# Patient Record
Sex: Female | Born: 1975 | Hispanic: No | Marital: Married | Smoking: Never smoker
Health system: Southern US, Community
[De-identification: ages and names within clinical notes are randomized; demographics above are authoritative.]

## PROBLEM LIST (undated history)

## (undated) DIAGNOSIS — N921 Excessive and frequent menstruation with irregular cycle: Secondary | ICD-10-CM

## (undated) DIAGNOSIS — J4520 Mild intermittent asthma, uncomplicated: Secondary | ICD-10-CM

## (undated) DIAGNOSIS — E069 Thyroiditis, unspecified: Secondary | ICD-10-CM

## (undated) DIAGNOSIS — F411 Generalized anxiety disorder: Secondary | ICD-10-CM

## (undated) DIAGNOSIS — J3081 Allergic rhinitis due to animal (cat) (dog) hair and dander: Secondary | ICD-10-CM

## (undated) DIAGNOSIS — I8393 Asymptomatic varicose veins of bilateral lower extremities: Secondary | ICD-10-CM

## (undated) DIAGNOSIS — F338 Other recurrent depressive disorders: Secondary | ICD-10-CM

## (undated) DIAGNOSIS — R928 Other abnormal and inconclusive findings on diagnostic imaging of breast: Principal | ICD-10-CM

## (undated) DIAGNOSIS — K219 Gastro-esophageal reflux disease without esophagitis: Secondary | ICD-10-CM

## (undated) DIAGNOSIS — R1110 Vomiting, unspecified: Secondary | ICD-10-CM

## (undated) HISTORY — DX: Asymptomatic varicose veins of bilateral lower extremities: I83.93

## (undated) HISTORY — DX: Allergic rhinitis due to animal (cat) (dog) hair and dander: J30.81

## (undated) HISTORY — DX: Gastro-esophageal reflux disease without esophagitis: K21.9

## (undated) HISTORY — PX: ESOPHAGOGASTRODUODENOSCOPY: SHX1529

## (undated) HISTORY — DX: Excessive and frequent menstruation with irregular cycle: N92.1

## (undated) HISTORY — DX: Thyroiditis, unspecified: E06.9

## (undated) HISTORY — DX: Other recurrent depressive disorders: F33.8

## (undated) HISTORY — DX: Mild intermittent asthma, uncomplicated: J45.20

## (undated) HISTORY — DX: Generalized anxiety disorder: F41.1

## (undated) HISTORY — DX: Vomiting, unspecified: R11.10

---

## 2008-06-21 LAB — CONVERTED CEMR LAB: Pap Smear: NEGATIVE

## 2009-04-19 ENCOUNTER — Ambulatory Visit: Payer: Self-pay | Admitting: Internal Medicine

## 2009-04-19 DIAGNOSIS — J45909 Unspecified asthma, uncomplicated: Secondary | ICD-10-CM | POA: Insufficient documentation

## 2009-04-19 DIAGNOSIS — R61 Generalized hyperhidrosis: Secondary | ICD-10-CM | POA: Insufficient documentation

## 2009-04-19 DIAGNOSIS — J309 Allergic rhinitis, unspecified: Secondary | ICD-10-CM | POA: Insufficient documentation

## 2009-04-20 LAB — CONVERTED CEMR LAB
ALT: 12 units/L (ref 0–35)
AST: 19 units/L (ref 0–37)
Alkaline Phosphatase: 44 units/L (ref 39–117)
Basophils Absolute: 0 10*3/uL (ref 0.0–0.1)
Basophils Relative: 0.3 % (ref 0.0–3.0)
Bilirubin, Direct: 0.1 mg/dL (ref 0.0–0.3)
CO2: 26 meq/L (ref 19–32)
Calcium: 9.3 mg/dL (ref 8.4–10.5)
FSH: 0.2 milliintl units/mL
Hemoglobin: 14.6 g/dL (ref 12.0–15.0)
MCHC: 33.6 g/dL (ref 30.0–36.0)
MCV: 95.3 fL (ref 78.0–100.0)
Potassium: 4 meq/L (ref 3.5–5.1)
RBC: 4.57 M/uL (ref 3.87–5.11)
Total Protein: 7.8 g/dL (ref 6.0–8.3)
WBC: 8.8 10*3/uL (ref 4.5–10.5)

## 2009-05-30 ENCOUNTER — Telehealth: Payer: Self-pay | Admitting: Internal Medicine

## 2009-08-10 ENCOUNTER — Ambulatory Visit: Payer: Self-pay | Admitting: Internal Medicine

## 2009-08-10 DIAGNOSIS — J45901 Unspecified asthma with (acute) exacerbation: Secondary | ICD-10-CM | POA: Insufficient documentation

## 2009-08-10 DIAGNOSIS — J019 Acute sinusitis, unspecified: Secondary | ICD-10-CM | POA: Insufficient documentation

## 2009-08-13 ENCOUNTER — Telehealth: Payer: Self-pay | Admitting: Internal Medicine

## 2010-04-30 NOTE — Assessment & Plan Note (Signed)
Summary: SORE THROAT/VIRUS/ WANTED EARLIER THAN 11:30 W/ VAL/NWS   Vital Signs:  Patient profile:   35 year old female Weight:      136 pounds Temp:     98.9 degrees F oral BP sitting:   116 / 64  (left arm) Cuff size:   regular  Vitals Entered By: Duard Brady LPN (Aug 10, 2009 9:34 AM) CC: c/o sore throat , fever 99-101 all week Is Patient Diabetic? No   Primary Care Provider:  Newt Lukes MD  CC:  c/o sore throat  and fever 99-101 all week.  History of Present Illness: here with 1 wk onset severe ST and now facial pain, pressure and greenish d/c, fever to 101 with general malaise and weakness after recent travel;  seen Mon at urgent care but pain seems worse;   Pt denies CP,  orthopnea, pnd, worsening LE edema, palps, dizziness or syncope, but with increased chest tightness, harder to take deep breath and wheeze for the last 1-2 days.   Husband was ill too but symptoms were different.  All despite asa, motrin, chloraseptic.   Allergy meds seems to have worked well until this past wk without significant nasal allegy congestion.  Planning to move permanetnly to [place] soon - she and husband relocating.  Preventive Screening-Counseling & Management  Alcohol-Tobacco     Smoking Status: never  Problems Prior to Update: 1)  Asthma Unspecified With Exacerbation  (ICD-493.92) 2)  Sinusitis- Acute-nos  (ICD-461.9) 3)  Sweating  (ICD-780.8) 4)  Asthma  (ICD-493.90) 5)  Allergic Rhinitis  (ICD-477.9)  Medications Prior to Update: 1)  Necon 10/11 (28) 35 Mcg Tabs (Norethin-Eth Estrad Biphasic) .... Take As Directed 2)  Multivitamins  Tabs (Multiple Vitamin) .... Once Daily 3)  Proair Hfa 108 (90 Base) Mcg/act Aers (Albuterol Sulfate) .... As Needed 4)  Benadryl 25 Mg Tabs (Diphenhydramine Hcl) .... As Needed 5)  Drysol 20 % Soln (Aluminum Chloride) .... Apply At Bedtime  Current Medications (verified): 1)  Necon 10/11 (28) 35 Mcg Tabs (Norethin-Eth Estrad Biphasic) ....  Take As Directed 2)  Multivitamins  Tabs (Multiple Vitamin) .... Once Daily 3)  Proair Hfa 108 (90 Base) Mcg/act Aers (Albuterol Sulfate) .... 2 Puffs Four Times Per Day As Needed 4)  Benadryl 25 Mg Tabs (Diphenhydramine Hcl) .... As Needed 5)  Drysol 20 % Soln (Aluminum Chloride) .... Apply At Bedtime 6)  Azithromycin 250 Mg Tabs (Azithromycin) .... 2po Qd For 1 Day, Then 1po Qd For 4days, Then Stop 7)  Hydrocodone-Homatropine 5-1.5 Mg/34ml Syrp (Hydrocodone-Homatropine) .Marland Kitchen.. 1 Tsp By Mouth Q 6 Hrs As Needed Cough 8)  Prednisone 10 Mg Tabs (Prednisone) .... 4po Qd For 3days, Then 3po Qd For 3days, Then 2po Qd For 3days, Then 1po Qd For 3 Days, Then Stop  Allergies: 1)  ! 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  , mild ill Head:  normocephalic and atraumatic.   Eyes:  vision grossly intact, pupils equal, and pupils round.   Ears:  bilat tm's mild red, sinus tender Nose:  nasal dischargemucosal pallor and mucosal edema.   Mouth:  pharyngeal erythema and fair dentition.   Neck:  supple and cervical lymphadenopathy.   Lungs:  normal respiratory effort, R decreased breath sounds, R wheezes, L decreased breath sounds, and L wheezes.   Heart:  normal rate and regular rhythm.   Extremities:  no edema, no erythema    Impression & Recommendations:  Problem # 1:  SINUSITIS- ACUTE-NOS (ICD-461.9)  mild to mod, for antibx, and cough med - treat as above, f/u any worsening signs or symptoms   Her updated medication list for this problem includes:    Azithromycin 250 Mg Tabs (Azithromycin)  .Marland Kitchen... 2po qd for 1 day, then 1po qd for 4days, then stop    Hydrocodone-homatropine 5-1.5 Mg/46ml Syrp (Hydrocodone-homatropine) .Marland Kitchen... 1 tsp by mouth q 6 hrs as needed cough  Problem # 2:  ASTHMA UNSPECIFIED WITH EXACERBATION (ICD-493.92)  Her updated medication list for this problem includes:    Proair Hfa 108 (90 Base) Mcg/act Aers (Albuterol sulfate) .Marland Kitchen... 2 puffs four times per day as needed    Prednisone 10 Mg Tabs (Prednisone) .Marland KitchenMarland KitchenMarland KitchenMarland Kitchen 4po qd for 3days, then 3po qd for 3days, then 2po qd for 3days, then 1po qd for 3 days, then stop for depomedrol today, and prednisone taper off, and proair refill - treat as above, f/u any worsening signs or symptoms   Orders: Depo- Medrol 80mg  (J1040) Depo- Medrol 40mg  (J1030) Admin of Therapeutic Inj  intramuscular or subcutaneous (52841)  Problem # 3:  ALLERGIC RHINITIS (ICD-477.9)  Her updated medication list for this problem includes:    Benadryl 25 Mg Tabs (Diphenhydramine hcl) .Marland Kitchen... As needed stable overall by hx and exam, ok to continue meds/tx as is   Complete Medication List: 1)  Necon 10/11 (28) 35 Mcg Tabs (Norethin-eth estrad biphasic) .... Take as directed 2)  Multivitamins Tabs (Multiple vitamin) .... Once daily 3)  Proair Hfa 108 (90 Base) Mcg/act Aers (Albuterol sulfate) .... 2 puffs four times per day as needed 4)  Benadryl 25 Mg Tabs (Diphenhydramine hcl) .... As needed 5)  Drysol 20 % Soln (Aluminum chloride) .... Apply at bedtime 6)  Azithromycin 250 Mg Tabs (Azithromycin) .... 2po qd for 1 day, then 1po qd for 4days, then stop 7)  Hydrocodone-homatropine 5-1.5 Mg/72ml Syrp (Hydrocodone-homatropine) .Marland Kitchen.. 1 tsp by mouth q 6 hrs as needed cough 8)  Prednisone 10 Mg Tabs (Prednisone) .... 4po qd for 3days, then 3po qd for 3days, then 2po qd for 3days, then 1po qd for 3 days, then stop  Patient Instructions: 1)  you had the steroid shot today 2)  Please take all new medications as prescribed - the antibiotic, cough medicine, and  prednisone 3)  you are given the refill on the proair 4)  Continue all previous medications as before this visit 5)  Please schedule a follow-up appointment or call with any worsening symptoms 6)  Good Luck in [place] Prescriptions: PREDNISONE 10 MG TABS (PREDNISONE) 4po qd for 3days, then 3po qd for 3days, then 2po qd for 3days, then 1po qd for 3 days, then stop  #30 x 0   Entered and Authorized by:   Corwin Levins MD   Signed by:   Corwin Levins MD on 08/10/2009   Method used:   Print then Give to Patient   RxID:   [id number] HYDROCODONE-HOMATROPINE 5-1.5 MG/5ML SYRP (HYDROCODONE-HOMATROPINE) 1 tsp by mouth q 6 hrs as needed cough  #6oz x 1   Entered and Authorized by:   Corwin Levins MD   Signed  by:   Corwin Levins MD on 08/10/2009   Method used:   Print then Give to Patient   RxID:   [id number] PROAIR HFA 108 (90 BASE) MCG/ACT AERS (ALBUTEROL SULFATE) 2 puffs four times per day as needed  #1 x 11   Entered and Authorized by:   Corwin Levins MD   Signed by:   Corwin Levins MD on 08/10/2009   Method used:   Print then Give to Patient   RxID:   [id number] AZITHROMYCIN 250 MG TABS (AZITHROMYCIN) 2po qd for 1 day, then 1po qd for 4days, then stop  #6 x 1   Entered and Authorized by:   Corwin Levins MD   Signed by:   Corwin Levins MD on 08/10/2009   Method used:   Print then Give to Patient   RxID:   [id number]    Medication Administration  Injection # 1:    Medication: Depo- Medrol 80mg     Diagnosis: ASTHMA UNSPECIFIED WITH EXACERBATION (ICD-493.92)    Route: IM    Site: RUOQ gluteus    Exp Date: 01/2012    Lot #: [id number]    Mfr: [employer]    Patient tolerated injection without complications    Given by: Duard Brady LPN (Aug 10, 2009 10:34 AM)  Injection # 2:    Medication: Depo- Medrol 40mg     Diagnosis: ASTHMA UNSPECIFIED WITH EXACERBATION [phone])    Route: IM    Site: RUOQ gluteus    Exp Date: 01/2012    Lot #: [id number]    Mfr: [employer]     Patient tolerated injection without complications    Given by: Duard Brady LPN (Aug 10, 2009 10:34 AM)  Orders Added: 1)  Depo- Medrol 80mg  [J1040] 2)  Depo- Medrol 40mg  [J1030] 3)  Admin of Therapeutic Inj  intramuscular or subcutaneous [96372] 4)  Est. Patient Level IV [71696]

## 2010-04-30 NOTE — Progress Notes (Signed)
Summary: Allergic reaction?  Phone Note Call from Patient Call back at [facility] Phone [phone]   Summary of Call: Pt has hives on elbow,arms and hands. Pt given antibiotic on 5/13 but hives just appeared, pt went to pharmacy and pharmacist advised her to take benaydryl so she took the benadryl. Should she stop antibitoic and prednisone, please let pt know....   Courtney Mullins, Courtney Mullins (05-22-1975) [phone]  Cheryl  Follow-up for Phone Call        Ok to advise pt to stop ABX and continue Pred and Benadryl? Follow-up by: Margaret Pyle, CMA,  Aug 13, 2009 11:39 AM  Additional Follow-up for Phone Call Additional follow up Details #1::        should stop the zpack (and inform pt this is likely the cuase - no more zpack in the future - likely new allergy);  Continue all previous medications including the prednisone ;  will hold on further medicaiton if her infectious symptoms are now improved Additional Follow-up by: Corwin Levins MD,  Aug 13, 2009 12:38 PM   New Allergies: ! * AZITHROMYCIN Additional Follow-up for Phone Call Additional follow up Details #2::    pt informed and will call back if needed for infection sxs Follow-up by: Margaret Pyle, CMA,  Aug 13, 2009 3:33 PM  New Allergies: ! * AZITHROMYCIN

## 2010-04-30 NOTE — Progress Notes (Signed)
Summary: drysol  Phone Note Call from Patient Call back at [facility] Phone [phone]   Caller: Patient Summary of Call: pt called to ask MD if she thought that Drysol 35ml would be help with her sweating, please advise. Initial call taken by: Margaret Pyle, CMA,  May 30, 2009 3:26 PM  Follow-up for Phone Call        drysol is a topical solution to apply at night - ok to try - e-rx done Follow-up by: Newt Lukes MD,  May 30, 2009 4:07 PM  Additional Follow-up for Phone Call Additional follow up Details #1::        pt informed Additional Follow-up by: Margaret Pyle, CMA,  May 30, 2009 4:21 PM    New/Updated Medications: DRYSOL 20 % SOLN (ALUMINUM CHLORIDE) apply at bedtime Prescriptions: DRYSOL 20 % SOLN (ALUMINUM CHLORIDE) apply at bedtime  #1 x 3   Entered and Authorized by:   Newt Lukes MD   Signed by:   Newt Lukes MD on 05/30/2009   Method used:   Electronically to        [id number] (retail)       [address]       [place], [place]  [address]       Ph: [phone] or [phone]       Fax: [phone]   RxID:   [id number]

## 2010-04-30 NOTE — Assessment & Plan Note (Signed)
Summary: NEW/BCBS/ REFERRED BY OBGYN/NWS   Vital Signs:  Patient profile:   35 year old female Height:      68.5 inches (173.99 cm) Weight:      139.4 pounds (63.36 kg) BMI:     20.96 O2 Sat:      98 % on Room air Temp:     99.2 degrees F (37.33 degrees C) oral Pulse rate:   84 / minute BP sitting:   126 / 90  (left arm) Cuff size:   regular  Vitals Entered By: Orlan Leavens (April 19, 2009 11:13 AM)  O2 Flow:  Room air CC: New patient Is Patient Diabetic? No Pain Assessment Patient in pain? no        Primary Care Provider:  Newt Lukes MD  CC:  New patient.  History of Present Illness: new pt to me and our practice - here today to est care  concerned about sweating - onset 1year ago but worse in past 2-3 mos - symptoms are present daily, several episodes of drenching thro daiy - not seeming related to menstral cycle - no change in medications in past year but +move to [place] from [place] and [place] in past year - no fever or chills no out of country travel - no rash no weight loss no lumps or swelling - no cough or rash - denies risk factors for HIV infx or TB symptoms not a/w increase exertion or stress +night sweats requiring change of night clothes and bedding last week symptoms not improved by use of benadryl instead of zyrtec for allg recent labs at gyn show normal thyroid - pt concerned also about hormone babance causing problem  Preventive Screening-Counseling & Management  Alcohol-Tobacco     Smoking Status: never  Current Medications (verified): 1)  Necon 10/11 (28) 35 Mcg Tabs (Norethin-Eth Estrad Biphasic) .... Take As Directed 2)  Multivitamins  Tabs (Multiple Vitamin) .... 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 I have reviewed all other systems and they were negative.   Physical Exam  General:  alert, well-developed, well-nourished, and cooperative to examination.    Eyes:  vision grossly intact; pupils equal, round and reactive to light.  conjunctiva and lids normal.    Ears:  normal pinnae bilaterally, without erythema, swelling, or tenderness to palpation. TMs clear, without effusion, or cerumen impaction. Hearing grossly normal bilaterally  Mouth:  teeth and gums in good repair; mucous membranes moist, without lesions or ulcers. oropharynx clear without exudate, no erythema.  Lungs:  normal respiratory effort, no intercostal retractions or use of accessory muscles; normal breath sounds bilaterally - no crackles and no wheezes.    Heart:  normal rate, regular rhythm, no murmur, and no rub. BLE without edema. normal DP pulses and normal cap refill in all 4 extremities    Abdomen:  soft, non-tender, normal bowel sounds, no distention; no masses and no appreciable hepatomegaly or splenomegaly.   Msk:  No deformity or scoliosis noted of thoracic or lumbar spine.   Neurologic:  alert & oriented X3 and cranial nerves II-XII symetrically intact.  strength normal in all extremities, sensation  intact to light touch, and gait normal. speech fluent without dysarthria or aphasia; follows commands with good comprehension.  Skin:  no rashes, vesicles, ulcers, or erythema. No nodules or irregularity to palpation.  Cervical Nodes:  No lymphadenopathy noted Axillary Nodes:  No palpable lymphadenopathy Inguinal Nodes:  No significant adenopathy Psych:  Oriented X3, memory intact for recent and remote, normally interactive, good eye contact, not anxious appearing, not depressed appearing, and not agitated.      Impression &  Recommendations:  Problem # 1:  SWEATING (ICD-780.8) up to date article reviewed in depth as to work up and explained to pt - no obvious risks for infx or malignancy on hx - TSH at gyn recently tested and norma (reviewed fax copy of tests today) will check other labs to r/o med dz - also CXR to look to mediastinal changes or TB changes - FSH at pt request d/t concern for early menopause but note on BCP - rec f/u back with gyn on this Time spent with patient 45 minutes, more than 50% of this time was spent counseling patient on causes for sweating and medical workup of same - - 1% of population affected by excess sweating - also my be most likely [place] (same time frame as onset of symptoms)  Orders: TLB-BMP (Basic Metabolic Panel-BMET) (80048-METABOL) TLB-CBC Platelet - w/Differential (85025-CBCD) TLB-Hepatic/Liver Function Pnl (80076-HEPATIC) T-2 View CXR (71020TC) TLB-FSH (Follicle Stimulating Hormone) (83001-FSH)  Problem # 2:  ALLERGIC RHINITIS (ICD-477.9)  Discussed use of allergy medications and environmental measures.   Her updated medication list for this problem includes:    Benadryl 25 Mg Tabs (Diphenhydramine hcl) .Marland Kitchen... As needed  Complete Medication List: 1)  Necon 10/11 (28) 35 Mcg Tabs (Norethin-eth estrad biphasic) .... Take as directed 2)  Multivitamins Tabs (Multiple vitamin) .... Once daily 3)  Proair Hfa 108 (90 Base) Mcg/act Aers (Albuterol sulfate) .... As needed 4)  Benadryl 25 Mg Tabs (Diphenhydramine hcl) .... As needed  Patient Instructions: 1)  it was good to see you today.  2)  test(s) ordered today - your results will be posted on the phone tree for review in 48-72 hours from the time of test completion; call [phone] and enter your 9 digit MRN (listed above on this page, just below your name); if any changes need to be made or there are abnormal results, you will be contacted directly.  3)  If no  apparent medical reason identified for sweating found on these labs, we can feel reassured for now that this may be a normal variant or result of your environmental changes - however, if your symptoms change, you should contact us for re-evaluation - 4)  if you desire further evaluation by an allergist, please let us know and we can make a referrral for you   Pap Smear  Procedure date:  06/21/2008  Findings:      Interpretation/Result:Negative for intraepithelial Lesion or Malignancy.

## 2010-09-20 HISTORY — PX: TUBAL LIGATION: SHX77

## 2011-09-11 ENCOUNTER — Encounter: Payer: Self-pay | Admitting: Family Medicine

## 2011-09-11 ENCOUNTER — Ambulatory Visit (INDEPENDENT_AMBULATORY_CARE_PROVIDER_SITE_OTHER): Payer: BC Managed Care – PPO | Admitting: Family Medicine

## 2011-09-11 VITALS — BP 124/87 | HR 91 | Temp 98.0°F | Ht 67.75 in | Wt 137.0 lb

## 2011-09-11 DIAGNOSIS — F411 Generalized anxiety disorder: Secondary | ICD-10-CM

## 2011-09-11 DIAGNOSIS — R1110 Vomiting, unspecified: Secondary | ICD-10-CM

## 2011-09-11 NOTE — Assessment & Plan Note (Signed)
Extensive workup done in [place]. Will get records and review them to see if anything else is warranted. She'll return in 2 wks to discuss.

## 2011-09-11 NOTE — Progress Notes (Signed)
Office Note 09/11/2011  CC:  Chief Complaint  Patient presents with  . Establish Care    discuss citalopram    HPI:  Courtney Mullins is a 36 y.o. White female who is here to establish care. Patient's most recent primary MD:  MD in [place]. Old records were not reviewed prior to or during today's visit.  Pt was started on citalopram 20mg  about a year ago for GAD and depression and it helped, but she thinks it has actually made her too relaxed, nonchalant.  She forgets things more easily--forgot where she placed her wedding ring recently and hasn't found it yet.  She feels like since she moved from [place] back to [place] she may be able to ween off this med altogether.  She has hx of chronic worry but no panic attacks.  Low grade/mild depression in [place] (sounds like seasonal affective disorder).  No hx of any other psych meds prior to citalopram.  Also says she has started having an old problem resurface in the last 2 wks: recurrent episodes of vomiting while eating, seems random (no trigger consistently identified).  She describes feeling flushing in neck and face, feels like her cheeks may be tingly or swollen, then she vomits.  She then feels immediately well and can go right back to eating exactly what she was eating.  Has some epigastric pain with it.  She says EGD has been done and some ulcers were found, H pylori neg.  She says an allergy evaluation was done and was unremarkable, including food allergy testing.  She says it was then thought that maybe stress/anxiety/panic was the cause so she was put on the citalopram and it has not affected these episodes at all she says.  She was put on daily nonsedating antihistamine (allegra, zyrtec, claritin) with no effect.  Prevacid, prilosec, and ranitidine have no impact on these episodes.  Past Medical History  Diagnosis Date  . Asthma, intermittent controlled   . Depression   . Recurrent vomiting   . GERD (gastroesophageal reflux  disease)     Past Surgical History  Procedure Date  . Tubal ligation   . Esophagogastroduodenoscopy     Family History  Problem Relation Age of Onset  . Cancer Father      prostate and testicular  . Dementia Father     Died age 67 of Lewy body dementia  . Stroke Maternal Grandfather   . Stroke Paternal Grandfather     History   Social History  . Marital Status: Married    Spouse Name: Clifton Custard    Number of Children: 1  . Years of Education: N/A   Occupational History  . Not on file.   Social History Main Topics  . Smoking status: Never Smoker   . Smokeless tobacco: Never Used  . Alcohol Use: No  . Drug Use: No  . Sexually Active:    Other Topics Concern  . Not on file   Social History Narrative   Married, one 6 y/o son.Orig from [place].Stay home mom.  Husband works for [employer].No T/A/Ds    Outpatient Encounter Prescriptions as of 09/11/2011  Medication Sig Dispense Refill  . citalopram (CELEXA) 20 MG tablet Take 20 mg by mouth daily.        Allergies  Allergen Reactions  . Percocet (Oxycodone-Acetaminophen)   . Sulfa Antibiotics     ROS Review of Systems  Constitutional: Negative for fever and fatigue.  HENT: Negative for congestion and sore throat.   Eyes:  Negative for visual disturbance.  Respiratory: Negative for cough.   Cardiovascular: Negative for chest pain.  Gastrointestinal: Positive for vomiting and abdominal pain. Negative for nausea.       As per HPI  Genitourinary: Negative for dysuria.  Musculoskeletal: Negative for back pain and joint swelling.  Skin: Negative for rash.  Neurological: Negative for weakness and headaches.  Hematological: Negative for adenopathy.  Psychiatric/Behavioral: Negative for disturbed wake/sleep cycle and dysphoric mood. The patient is not nervous/anxious.     PE; Blood pressure 124/87, pulse 91, temperature 98 F (36.7 C), temperature source Temporal, height 5' 7.75" (1.721 m), weight 137 lb (62.143  kg), last menstrual period 08/22/2011, SpO2 100.00%. Gen: Alert, well appearing.  Patient is oriented to person, place, time, and situation. ENT: Ears: EACs clear, normal epithelium.  TMs with good light reflex and landmarks bilaterally.  Eyes: no injection, icteris, swelling, or exudate.  EOMI, PERRLA. Nose: no drainage or turbinate edema/swelling.  No injection or focal lesion.  Mouth: lips without lesion/swelling.  Oral mucosa pink and moist.  Dentition intact and without obvious caries or gingival swelling.  Oropharynx without erythema, exudate, or swelling.  Neck - No masses or thyromegaly or limitation in range of motion CV: RRR, no m/r/g.   LUNGS: CTA bilat, nonlabored resps, good aeration in all lung fields. ABD: soft, NT, ND, BS normal.  No hepatospenomegaly or mass.  No bruits. EXT: no clubbing, cyanosis, or edema.  Skin - no sores or suspicious lesions or rashes or color changes  Pertinent labs:  None today  ASSESSMENT AND PLAN:   New pt: obtain old records  GAD (generalized anxiety disorder) OK to slowly ween off this med and see how she does. Take 1/2 of her 20mg  citalopram for a few wks, then when she gets down to 5 tabs left she'll take 1/2 tab every other day. She will call or return if she has rebound sx's or issues with weening.   Recurrent vomiting Extensive workup done in [place]. Will get records and review them to see if anything else is warranted. She'll return in 2 wks to discuss.    Return in about 2 weeks (around 09/25/2011).

## 2011-09-11 NOTE — Patient Instructions (Addendum)
Take your citalopram 1/2 20mg  tab until you have 5 left, then take 1/2 tab every other day until they are gone.

## 2011-09-11 NOTE — Assessment & Plan Note (Addendum)
OK to slowly ween off this med and see how she does. Take 1/2 of her 20mg  citalopram for a few wks, then when she gets down to 5 tabs left she'll take 1/2 tab every other day. She will call or return if she has rebound sx's or issues with weening.

## 2011-09-24 ENCOUNTER — Ambulatory Visit: Payer: BC Managed Care – PPO | Admitting: Family Medicine

## 2011-09-29 ENCOUNTER — Encounter: Payer: Self-pay | Admitting: Family Medicine

## 2011-10-08 ENCOUNTER — Ambulatory Visit: Payer: BC Managed Care – PPO | Admitting: Family Medicine

## 2011-10-09 ENCOUNTER — Other Ambulatory Visit: Payer: Self-pay | Admitting: Family Medicine

## 2011-10-09 ENCOUNTER — Encounter: Payer: Self-pay | Admitting: Family Medicine

## 2011-10-09 ENCOUNTER — Ambulatory Visit (INDEPENDENT_AMBULATORY_CARE_PROVIDER_SITE_OTHER): Payer: BC Managed Care – PPO | Admitting: Family Medicine

## 2011-10-09 ENCOUNTER — Ambulatory Visit (INDEPENDENT_AMBULATORY_CARE_PROVIDER_SITE_OTHER)
Admission: RE | Admit: 2011-10-09 | Discharge: 2011-10-09 | Disposition: A | Discharge status: Home / Self Care | Payer: BC Managed Care – PPO | Source: Ambulatory Visit | Attending: Family Medicine | Admitting: Family Medicine

## 2011-10-09 VITALS — BP 116/76 | HR 69 | Ht 67.75 in | Wt 143.0 lb

## 2011-10-09 DIAGNOSIS — R1110 Vomiting, unspecified: Secondary | ICD-10-CM

## 2011-10-09 DIAGNOSIS — IMO0001 Reserved for inherently not codable concepts without codable children: Secondary | ICD-10-CM | POA: Insufficient documentation

## 2011-10-09 DIAGNOSIS — T148XXA Other injury of unspecified body region, initial encounter: Secondary | ICD-10-CM

## 2011-10-09 DIAGNOSIS — S6390XA Sprain of unspecified part of unspecified wrist and hand, initial encounter: Secondary | ICD-10-CM

## 2011-10-09 DIAGNOSIS — F411 Generalized anxiety disorder: Secondary | ICD-10-CM

## 2011-10-09 MED ORDER — HYDROCODONE-ACETAMINOPHEN 5-500 MG PO TABS: ORAL_TABLET | ORAL | Status: DC

## 2011-10-09 MED ORDER — CITALOPRAM HYDROBROMIDE 20 MG PO TABS: 20.0000 mg | ORAL_TABLET | Freq: Every day | ORAL | Status: DC

## 2011-10-09 MED ORDER — BUSPIRONE HCL 7.5 MG PO TABS: ORAL_TABLET | ORAL | Status: DC

## 2011-10-09 MED ORDER — BUSPIRONE HCL 7.5 MG PO TABS: 7.5000 mg | ORAL_TABLET | Freq: Three times a day (TID) | ORAL | Status: DC

## 2011-10-09 NOTE — Assessment & Plan Note (Signed)
X-ray done in office today: looks normal to me but will call pt after radiologist reports his reading of it. I placed a splint on pt today: finger splint from prox 4th metacarpel extending to tip of 4th finger, buddy taped to middle finger.  The palmar aspect of this splint was then wrapped in an ace bandage. Vicodin 5/500, 1-2 q6h prn pain, #40, no RF.  Therapeutic expectations and side effect profile of medication discussed today.  Patient's questions answered.

## 2011-10-09 NOTE — Progress Notes (Signed)
2OFFICE VISIT  10/09/2011   CC:  Chief Complaint  Patient presents with  . Follow-up    anxiety  . Hand Pain    left ring finger, got caught in bike trailer Tuesday evening     HPI:    Patient is a 36 y.o. Caucasian female who presents for 1 mo f/u for anxiety. Felt anxiety and random n/v increase when she decreased her citalopram to 10mg . Again, describes periods in which she cannot keep anything down--nausea precedes the vomiting but afterwards she feels fine.  No connection with any particular food.  She even tried altering her diet as per her chiropracter's recommendations, tried chiropractic manipulations to see if this would help and the only thing that helped was getting back on full 20mg  tab of citalopram, and the thing that help most profoundly in the last week is a friend's meclizine tabs---no vomiting at all since starting this a week ago. Denies GERD.  No HA's.  No dizziness, no vertigo, no abd pain.   Reviewed past GI w/u and this has been pretty complete and without remarkable findings.  Also, she hurt her left hand ring finger 2 days ago (tripped in her garage and the finger got twisted a bit in a grate-like surface on a trailer in her garage.  Pain and swelling has gradually worsened.  Hurst the worst on volar aspect, worst when flexion/extending finger.  Ice has helped some but tylenol hasn't.  She has not splinted it.  She avoids NSAIDs due to past problems with her stomach.  Past Medical History  Diagnosis Date  . Asthma, intermittent controlled     vs mild persistent  . Seasonal affective disorder     while living in [place]  . 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  Stomach bx-mild chronic gastritis, H pylori neg.  Duodenal bx normal.  . GERD (gastroesophageal reflux disease)   . Thyroiditis 6th grade    has been euthyroid x many years since this.  . 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  Marland Kitchen GAD  (generalized anxiety disorder)     Past Surgical History  Procedure Date  . Tubal ligation 09/20/10    Hysteroscopic tubal ligation  . Esophagogastroduodenoscopy     Outpatient Prescriptions Prior to Visit  Medication Sig Dispense Refill  . citalopram (CELEXA) 20 MG tablet Take 20 mg by mouth daily.        Allergies  Allergen Reactions  . Percocet (Oxycodone-Acetaminophen) Other (See Comments)    Restlessness  . Sulfa Antibiotics     ROS As per HPI  PE: Blood pressure 116/76, pulse 69, height 5' 7.75" (1.721 m), weight 143 lb (64.864 kg), last menstrual period 10/08/2011. Gen: Alert, well appearing.  Patient is oriented to person, place, time, and situation. CV: RRR, no m/r/g.   LUNGS: CTA bilat, nonlabored resps, good aeration in all lung fields. ABD: soft, NT, ND, BS diminished throughout all 4 quadrants.  No hepatospenomegaly or mass.  No bruits. Neuro: CN 2-12 intact bilaterally, strength 5/5 in proximal and distal upper extremities and lower extremities bilaterally.    No tremor.  No disdiadochokinesis.  No ataxia.   Left hand: mild swelling w/out erythema over ring finger PIP jt.  Moderate TTP over volar>dorsal aspects of ring finger from PIP jt to DIP.  All other regions nontender, including lateral collateral ligaments--no instability in these areas.  She has some trouble flexing and extending at  her PIP and DIP but SOME movement is seen and this is mainly inhibited by pain.    LABS:  X-ray in office today (left hand 4th finger): no fracture or dislocation by my reading; awaiting radiology over-read.  IMPRESSION AND PLAN:  GAD (generalized anxiety disorder) Improving now that she's back on 20mg  citalopram. Will add buspar 7.5 mg generic tabs, 2 tabs bid (start at 1 bid x 10d, then increase to 2 bid).  Therapeutic expectations and side effect profile of medication discussed today.  Patient's questions answered. F/u 60mo.  Recurrent vomiting GI w/u unremarkable.   Seems highly connected to her anxiety disorder. Continue anxiety meds, may continue prn meclizine as well.  Strain of fourth finger, left X-ray done in office today: looks normal to me but will call pt after radiologist reports his reading of it. I placed a splint on pt today: finger splint from prox 4th metacarpel extending to tip of 4th finger, buddy taped to middle finger.  The palmar aspect of this splint was then wrapped in an ace bandage. Vicodin 5/500, 1-2 q6h prn pain, #40, no RF.  Therapeutic expectations and side effect profile of medication discussed today.  Patient's questions answered.     FOLLOW UP: Return for 2 mo.  Or earlier, depending on the improvement of her hand and the x-ray result.

## 2011-10-09 NOTE — Assessment & Plan Note (Signed)
GI w/u unremarkable.  Seems highly connected to her anxiety disorder. Continue anxiety meds, may continue prn meclizine as well.

## 2011-10-09 NOTE — Assessment & Plan Note (Signed)
Improving now that she's back on 20mg  citalopram. Will add buspar 7.5 mg generic tabs, 2 tabs bid (start at 1 bid x 10d, then increase to 2 bid).  Therapeutic expectations and side effect profile of medication discussed today.  Patient's questions answered. F/u 58mo.

## 2011-11-20 ENCOUNTER — Other Ambulatory Visit: Payer: Self-pay | Admitting: *Deleted

## 2011-11-20 MED ORDER — BUSPIRONE HCL 7.5 MG PO TABS: 15.0000 mg | ORAL_TABLET | Freq: Two times a day (BID) | ORAL | Status: DC

## 2011-11-20 NOTE — Telephone Encounter (Signed)
Pt called stating that she has been getting  Buspirone #90, take 1 TID from pharmacy although she has been told by our office to take 2 BID.  Advised pt that she should be taking 2 BID.  Advised pt I will call [facility] and follow up with her.   Researched with [facility] and in [facility].  RC to pt.  Advised our office is partly in error for the incorrect directions.  Per Jonny Ruiz at [facility] they never received the RX stating 2 BID.  They did receive RX for TID.  Per [facility] we did send TID then changed to BID in same day.   Advised pt we have corrected RX with [facility] (verbal to John) for 30 day supply while I was on phone.  Sent 90 day through [facility].  Pt was notified.  She will keep follow up on 9/11.

## 2011-11-25 ENCOUNTER — Encounter: Payer: Self-pay | Admitting: Family Medicine

## 2011-11-25 ENCOUNTER — Ambulatory Visit (INDEPENDENT_AMBULATORY_CARE_PROVIDER_SITE_OTHER): Payer: BC Managed Care – PPO | Admitting: Family Medicine

## 2011-11-25 VITALS — BP 115/78 | HR 80 | Temp 96.6°F | Wt 144.0 lb

## 2011-11-25 DIAGNOSIS — L811 Chloasma: Secondary | ICD-10-CM

## 2011-11-25 DIAGNOSIS — L819 Disorder of pigmentation, unspecified: Secondary | ICD-10-CM

## 2011-11-25 DIAGNOSIS — G4700 Insomnia, unspecified: Secondary | ICD-10-CM

## 2011-11-25 DIAGNOSIS — N912 Amenorrhea, unspecified: Secondary | ICD-10-CM

## 2011-11-25 DIAGNOSIS — R238 Other skin changes: Secondary | ICD-10-CM

## 2011-11-25 DIAGNOSIS — B070 Plantar wart: Secondary | ICD-10-CM | POA: Insufficient documentation

## 2011-11-25 DIAGNOSIS — F411 Generalized anxiety disorder: Secondary | ICD-10-CM

## 2011-11-25 LAB — TSH: TSH: 2.22 u[IU]/mL (ref 0.35–5.50)

## 2011-11-25 LAB — PROLACTIN: Prolactin: 7.5 ng/mL

## 2011-11-25 LAB — COMPREHENSIVE METABOLIC PANEL
ALT: 22 U/L (ref 0–35)
AST: 28 U/L (ref 0–37)
CO2: 30 mEq/L (ref 19–32)
Calcium: 9.8 mg/dL (ref 8.4–10.5)
Chloride: 103 mEq/L (ref 96–112)
GFR: 74.13 mL/min (ref >60.00)
Sodium: 140 mEq/L (ref 135–145)
Total Bilirubin: 0.6 mg/dL (ref 0.3–1.2)
Total Protein: 7.5 g/dL (ref 6.0–8.3)

## 2011-11-25 LAB — CBC WITH DIFFERENTIAL/PLATELET
Basophils Absolute: 0 10*3/uL (ref 0.0–0.1)
Eosinophils Absolute: 0.1 10*3/uL (ref 0.0–0.7)
Lymphocytes Relative: 51 % — ABNORMAL HIGH (ref 12.0–46.0)
MCHC: 33 g/dL (ref 30.0–36.0)
Monocytes Relative: 6.6 % (ref 3.0–12.0)
Neutrophils Relative %: 40.4 % — ABNORMAL LOW (ref 43.0–77.0)
Platelets: 191 10*3/uL (ref 150.0–400.0)
RDW: 11.8 % (ref 11.5–14.6)

## 2011-11-25 LAB — TESTOSTERONE: Testosterone: 58.3 ng/dL (ref 10.00–70.00)

## 2011-11-25 LAB — PROGESTERONE: Progesterone: <0.2 ng/mL

## 2011-11-25 MED ORDER — CITALOPRAM HYDROBROMIDE 40 MG PO TABS: 40.0000 mg | ORAL_TABLET | Freq: Every day | ORAL | Status: DC

## 2011-11-25 NOTE — Progress Notes (Signed)
OFFICE NOTE  12/01/2011  CC:  Chief Complaint  Patient presents with  . acute    multiple issues     HPI: Patient is a 36 y.o. Caucasian female who is here for 61mo follow up anxiety--says no diff in anxiety level since buspar, maybe even acting a bit more irritable.   Much less GI issues since starting buspar.  Feels like her taste is less sensitive taste, having sleep initiation and maintenance problems, she feels like she has gained wt.  In general she feels LESS TIRED/less need for sleep on buspar. Has increase in malasma on face lately (above upper lip and a spot on forehead).  Easy bruisability. Says hands and feet "feel like ice" lately.    Left eye a bit red and irritated lately, dog sleeps on left side of patient's face/neck all the time.  No eye exudate or swelling.  Mild intermittent itching but no pain.     LMP was around end of May/beginning of June 2013, and she has always been very regular/predictable menses.  She has not done a home pregnancy test b/c she has had a tubal ligation. She asks about hormonal testing due to her conglomeration of sx's lately. Lastly, she reveals the primary reason for her appt today: left big toe with wart on bottom, hurts to walk on it, alters gait due to this is causing some ankle pains, even occ knee and hip discomfort on that side. Has tried OTC wart treatments the last month or so and nothing has helped.  Pertinent PMH:  Past Medical History  Diagnosis Date  . Asthma, intermittent controlled     vs mild persistent  . Seasonal affective disorder     while living in [place]  . 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  Stomach bx-mild chronic gastritis, H pylori neg.  Duodenal bx normal.  . GERD (gastroesophageal reflux disease)   . Thyroiditis 6th grade    has been euthyroid x many years since this.  . 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  Marland Kitchen GAD (generalized anxiety disorder)       MEDS:  Outpatient Prescriptions Prior to Visit  Medication Sig Dispense Refill  . HYDROcodone-acetaminophen (VICODIN) 5-500 MG per tablet 1-2 tabs po q6h prn pain  40 tablet  0  . busPIRone (BUSPAR) 7.5 MG tablet Take 2 tablets (15 mg total) by mouth 2 (two) times daily.  360 tablet  1  . citalopram (CELEXA) 20 MG tablet Take 1 tablet (20 mg total) by mouth daily.  30 tablet  3  **Note: she is not taking vicodin as listed above  PE: Blood pressure 115/78, pulse 80, temperature 96.6 F (35.9 C), temperature source Temporal, weight 144 lb (65.318 kg). Gen: Alert, well appearing.  Patient is oriented to person, place, time, and situation. AFFECT: pleasant, lucid thought and speech. ENT: Ears: EACs clear, normal epithelium.  TMs with good light reflex and landmarks bilaterally.  Eyes: no injection, icteris, swelling, or exudate.  EOMI, PERRLA. Nose: no drainage or turbinate edema/swelling.  No injection or focal lesion.  Mouth: lips without lesion/swelling.  Oral mucosa pink and moist.  Dentition intact and without obvious caries or gingival swelling.  Oropharynx without erythema, exudate, or swelling.  Neck - No masses or thyromegaly or limitation in range of motion CV: RRR, no m/r/g.   LUNGS: CTA bilat, nonlabored resps, good aeration in all lung fields. Neuro: CN 2-12 intact  bilaterally, strength 5/5 in proximal and distal upper extremities and lower extremities bilaterally. No tremor.  No disdiadochokinesis.  No ataxia.  IMPRESSION AND PLAN:  GAD (generalized anxiety disorder) Mild improvement on 20 mg citalopram.  No better with addition of buspar (in fact, possibly worse). Plan is to d/c buspar and increase citalopram to 40mg  once daily.  Therapeutic expectations and side effect profile of medication discussed today.  Patient's questions answered.   Plantar wart Left great toe:  After pairing back a bit of the callused skin overlying the wart, liquid nitrogen was applied for  10-12 seconds to the skin lesion for 3 freeze/thaw cycles, and the expected blistering or scabbing reaction explained. Do not pick at the areas. Patient reminded to expect hypopigmented scars from the procedure. Return if lesions fail to fully resolve.   Amenorrhea With melasma and easy bruisability. UPT neg here today. Multiple endo labs ordered today. Patient reassured.   FOLLOW UP: 1 mo

## 2011-11-25 NOTE — Patient Instructions (Signed)
STOP your Buspar (tapering off is not necessary).

## 2011-11-26 LAB — CORTISOL: Cortisol, Plasma: 7.3 ug/dL

## 2011-11-26 LAB — FOLLICLE STIMULATING HORMONE: FSH: 7.3 m[IU]/mL

## 2011-11-28 ENCOUNTER — Telehealth: Payer: Self-pay | Admitting: *Deleted

## 2011-11-28 NOTE — Telephone Encounter (Signed)
Yes, we certainly did talk about this and I recommend she take the Citalopram 40mg  daily dosing as prescribed.  We also talked about her stopping her buspar.  Does she recall this? -thx

## 2011-11-28 NOTE — Telephone Encounter (Signed)
Pt picked up Citalopram RX and noticed dose increase from 20-40mg .  She does not remember talking about increasing dose at last visit.  OV note is not complete.  Please advise if pt should up dose.  Thanks.

## 2011-11-28 NOTE — Telephone Encounter (Signed)
Advised pt to increase to 40 mg Citalopram.  Pt has stopped Buspar.

## 2011-12-01 DIAGNOSIS — N912 Amenorrhea, unspecified: Secondary | ICD-10-CM | POA: Insufficient documentation

## 2011-12-01 NOTE — Assessment & Plan Note (Signed)
Left great toe:  After pairing back a bit of the callused skin overlying the wart, liquid nitrogen was applied for 10-12 seconds to the skin lesion for 3 freeze/thaw cycles, and the expected blistering or scabbing reaction explained. Do not pick at the areas. Patient reminded to expect hypopigmented scars from the procedure. Return if lesions fail to fully resolve.

## 2011-12-01 NOTE — Assessment & Plan Note (Signed)
Mild improvement on 20 mg citalopram.  No better with addition of buspar (in fact, possibly worse). Plan is to d/c buspar and increase citalopram to 40mg  once daily.  Therapeutic expectations and side effect profile of medication discussed today.  Patient's questions answered.

## 2011-12-01 NOTE — Assessment & Plan Note (Addendum)
With melasma and easy bruisability. UPT neg here today. Multiple endo labs ordered today. Patient reassured.

## 2011-12-10 ENCOUNTER — Ambulatory Visit: Payer: BC Managed Care – PPO | Admitting: Family Medicine

## 2011-12-26 ENCOUNTER — Encounter: Payer: Self-pay | Admitting: Family Medicine

## 2011-12-26 ENCOUNTER — Ambulatory Visit (INDEPENDENT_AMBULATORY_CARE_PROVIDER_SITE_OTHER): Payer: BC Managed Care – PPO | Admitting: Family Medicine

## 2011-12-26 VITALS — BP 119/77 | HR 80 | Ht 67.75 in | Wt 147.0 lb

## 2011-12-26 DIAGNOSIS — J069 Acute upper respiratory infection, unspecified: Secondary | ICD-10-CM

## 2011-12-26 DIAGNOSIS — F411 Generalized anxiety disorder: Secondary | ICD-10-CM

## 2011-12-26 DIAGNOSIS — N912 Amenorrhea, unspecified: Secondary | ICD-10-CM

## 2011-12-26 DIAGNOSIS — B070 Plantar wart: Secondary | ICD-10-CM

## 2011-12-26 MED ORDER — ALBUTEROL SULFATE HFA 108 (90 BASE) MCG/ACT IN AERS: 2.0000 | INHALATION_SPRAY | Freq: Four times a day (QID) | RESPIRATORY_TRACT | Status: AC | PRN

## 2011-12-26 NOTE — Progress Notes (Signed)
OFFICE NOTE  12/26/2011  CC:  Chief Complaint  Patient presents with  . Follow-up    GAD, also plantar wart     HPI: Patient is a 36 y.o. Caucasian female who is here for 1 mo f/u GAD and left great toe plantar wart freezing. Increased citalopram to 40mg  qd and d/c'd buspar last visit.  Toe plantar wart still there--not much improved. Has had a URI lately x 4-5 d, some slight chest tightness, asks for RF of her ventolin. No fevers. Increase in citalopram no help for her stomach issues so she wants to ween off of it. She finds allegra D helpful and cyclic use of PPI for her chronic, intermittent vomiting episodes. Still has not had a menstrual period (it has been 4 mo or so).  She says this is fine with her b/c she would just assume not have periods, but she does wonder why.  She has seen a local GYN in the past but not for this problem.  Pertinent PMH:  Past Medical History  Diagnosis Date  . Asthma, intermittent controlled     vs mild persistent  . Seasonal affective disorder     while living in [place]  . 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  Stomach bx-mild chronic gastritis, H pylori neg.  Duodenal bx normal.  . GERD (gastroesophageal reflux disease)   . Thyroiditis 6th grade    has been euthyroid x many years since this.  . 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  Marland Kitchen GAD (generalized anxiety disorder)     MEDS:  Citalopram 40mg  qd, Ventolin HFA 1-2 puffs q4h prn  PE: Blood pressure 119/77, pulse 80, height 5' 7.75" (1.721 m), weight 147 lb (66.679 kg). VS: noted--normal. Gen: alert, NAD, NONTOXIC APPEARING. HEENT: eyes without injection, drainage, or swelling.  Ears: EACs clear, TMs with normal light reflex and landmarks.  Nose: Clear rhinorrhea, with some dried, crusty exudate adherent to mildly injected mucosa.  No purulent d/c.  No paranasal sinus TTP.  No facial swelling.  Throat and mouth without focal  lesion.  No pharyngial swelling, erythema, or exudate.   Neck: supple, no LAD.   LUNGS: CTA bilat, nonlabored resps.   CV: RRR, no m/r/g. EXT: no c/c/e SKIN: no rash. Left great toe: plantar wart with palpable tenderness.  No erythema    IMPRESSION AND PLAN:  Viral URI Discussed symptomatic care: she'll restart her allegra D. Since she has some RAD intermittently with this illness I did renew her albuterol inhaler (ProAir rx'd due to insurance restrictions). She feels like she is not having problems significant enough at this time to require controller therapy, but will call or return if use of albuterol becomes fairly regular.  GAD (generalized anxiety disorder) She wants to ween off of citalopram and does not want to start anything new at this time. Discussed ween: take 20mg  qd x 7d, then 10mg  qd x 7d, then stop.  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 Labs + UPT last month were unrevealing. She says she is comfortable doing no further w/u for this problem at this time, but will seek f/u with her GYN if she does decide to continue w/u.  Plantar wart Cryotherapy done again for three separate 45 second freeze/thaw cycles. Also gave rx for salicylic acid cream 6% to apply to the wart nightly and cover with tape.      FOLLOW UP: 1 mo (f/u  plantar wart and anxiety)

## 2011-12-26 NOTE — Assessment & Plan Note (Signed)
She wants to ween off of citalopram and does not want to start anything new at this time. Discussed ween: take 20mg  qd x 7d, then 10mg  qd x 7d, then stop.

## 2011-12-26 NOTE — Assessment & Plan Note (Signed)
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 Labs + UPT last month were unrevealing. She says she is comfortable doing no further w/u for this problem at this time, but will seek f/u with her GYN if she does decide to continue w/u.

## 2011-12-26 NOTE — Assessment & Plan Note (Signed)
Cryotherapy done again for three separate 45 second freeze/thaw cycles. Also gave rx for salicylic acid cream 6% to apply to the wart nightly and cover with tape.

## 2011-12-26 NOTE — Assessment & Plan Note (Signed)
Discussed symptomatic care: she'll restart her allegra D. Since she has some RAD intermittently with this illness I did renew her albuterol inhaler (ProAir rx'd due to insurance restrictions). She feels like she is not having problems significant enough at this time to require controller therapy, but will call or return if use of albuterol becomes fairly regular.

## 2012-04-13 ENCOUNTER — Encounter: Payer: Self-pay | Admitting: Family Medicine

## 2012-04-13 ENCOUNTER — Ambulatory Visit (INDEPENDENT_AMBULATORY_CARE_PROVIDER_SITE_OTHER): Payer: BC Managed Care – PPO | Admitting: Family Medicine

## 2012-04-13 VITALS — BP 116/81 | HR 71 | Ht 67.75 in | Wt 142.0 lb

## 2012-04-13 DIAGNOSIS — B070 Plantar wart: Secondary | ICD-10-CM

## 2012-04-13 NOTE — Progress Notes (Signed)
OFFICE NOTE  04/13/2012  CC:  Chief Complaint  Patient presents with  . Plantar Warts    left great toe, getting worse; unable to walk on      HPI: Patient is a 37 y.o. Caucasian female who is here for wart on bottom of left big toe. +Painful with wt bearing/can barely walk.  This is significantly worsening over time and she is seeing new warts appear around the original one.  I did histofreeze to this wart 11/25/11 and 12/26/11 and there was no significant improvement.  I also added 6% salicylic acid cream to apply nightly and occlude with tape at the 12/26/11 visit--not helping, obviously.  Pertinent PMH:  Past Medical History  Diagnosis Date  . Asthma, intermittent controlled     vs mild persistent  . Seasonal affective disorder     while living in [place]  . 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  Stomach bx-mild chronic gastritis, H pylori neg.  Duodenal bx normal.  . GERD (gastroesophageal reflux disease)   . Thyroiditis 6th grade    has been euthyroid x many years since this.  . 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  Marland Kitchen GAD (generalized anxiety disorder)     MEDS:  Outpatient Prescriptions Prior to Visit  Medication Sig Dispense Refill  . albuterol (PROAIR HFA) 108 (90 BASE) MCG/ACT inhaler Inhale 2 puffs into the lungs every 6 (six) hours as needed for wheezing.  1 Inhaler  1   Last reviewed on 04/13/2012 11:09 AM by Jeoffrey Massed, MD  PE: Blood pressure 116/81, pulse 71, height 5' 7.75" (1.721 m), weight 142 lb (64.411 kg). Gen: Alert, well appearing.  Patient is oriented to person, place, time, and situation. Left great toe plantar surface with one large wart without any callus over it.  There are 6-8 satellite verrucous lesions visible beneath some callused skin in the same region.  Tender to palpation over entire plantar surface of left great toe.  IMPRESSION AND PLAN:  Plantar wart Left great  toe. Worsening. My attempts at eradication have failed. I recommended she contact her dermatologist, Dr. Terri Piedra, for further evaluation and management of this problem. She expressed understanding, agreed with plan. She'll continue with prn ibuprofen or tylenol for pain.   An After Visit Summary was printed and given to the patient.  FOLLOW UP: prn

## 2012-04-13 NOTE — Patient Instructions (Addendum)
Call your Dermatologist, Dr. Terri Piedra, for appointment to evaluate plantar warts.

## 2012-04-13 NOTE — Assessment & Plan Note (Signed)
Left great toe. Worsening. My attempts at eradication have failed. I recommended she contact her dermatologist, Dr. Terri Piedra, for further evaluation and management of this problem. She expressed understanding, agreed with plan. She'll continue with prn ibuprofen or tylenol for pain.

## 2012-04-15 ENCOUNTER — Telehealth: Payer: Self-pay | Admitting: Family Medicine

## 2012-04-15 MED ORDER — TRAMADOL HCL 50 MG PO TABS: ORAL_TABLET | ORAL | Status: DC

## 2012-04-15 NOTE — Telephone Encounter (Signed)
Pt notified med called in and side effects.

## 2012-04-15 NOTE — Telephone Encounter (Signed)
Pt was seen on 1/14 for plantar wart.  Would like prescription pain relief.  No mention in the note from that day.  Please advise.  Thanks.

## 2012-04-15 NOTE — Telephone Encounter (Signed)
Rx for tramadol sent to pharmacy.  Please tell patient that this med may cause drowsiness.  She can still take tylenol or motrin with this med.-thx

## 2012-05-24 ENCOUNTER — Telehealth: Payer: Self-pay | Admitting: *Deleted

## 2012-05-24 NOTE — Telephone Encounter (Signed)
VM left by patient stating that she was told about a blood test that may check for certain food sensitivities.  Pt has been on "special diet" eliminating certain foods (she did not say which ones) and has felt better these last two weeks than she has in years.  Would like to know if blood work would be OK. RC to patient.  She has been avoiding foods with sulfides in them.  She got the list on-line.  Blood test that patient is asking about is called [facility].  She would like test to see what foods she may be intolerant to so she is not avoiding foods unnecessarily.  Pt states test has website--[email or web address].  Please advise if this is something you have heard of/approve of.

## 2012-05-28 NOTE — Telephone Encounter (Signed)
Pt.notified

## 2012-05-28 NOTE — Telephone Encounter (Signed)
Pt notified and is agreeable. 

## 2012-05-28 NOTE — Telephone Encounter (Signed)
Pls call pt and let her know I talked to the [facility] lab company and they will be calling her on Monday about getting these labs done.  They will send our office the kit for the blood sample, so she'll just have to set up a lab appt to come in for a blood draw.  Also, pls have her set up an appt in about 3 weeks to talk about this stuff, go over results, ect-thx

## 2012-05-28 NOTE — Telephone Encounter (Signed)
I went on the [facility] website and submitted a request for more info about [facility] testing providers in our area.  They said to expect 72h wait for response.  We don't offer any such testing.  Also, I advise her to contact her insurer and inquire about coverage for this b/c it is likely NOT something that they will pay for and it looks like it will be quite expensive, just an FYI.-thx

## 2012-06-03 ENCOUNTER — Other Ambulatory Visit: Payer: BC Managed Care – PPO

## 2012-06-16 ENCOUNTER — Other Ambulatory Visit (INDEPENDENT_AMBULATORY_CARE_PROVIDER_SITE_OTHER): Payer: BC Managed Care – PPO

## 2012-06-16 DIAGNOSIS — R1110 Vomiting, unspecified: Secondary | ICD-10-CM

## 2012-06-16 NOTE — Progress Notes (Signed)
Labs only

## 2012-06-25 ENCOUNTER — Telehealth: Payer: Self-pay | Admitting: *Deleted

## 2012-06-25 NOTE — Telephone Encounter (Signed)
Pt called requesting lab results; pt informed we have not yet to receive external results, please contact her lab source to have faxed over to office, understood & agreed/SLS

## 2012-07-22 ENCOUNTER — Encounter: Payer: Self-pay | Admitting: Family Medicine

## 2012-08-19 ENCOUNTER — Emergency Department (HOSPITAL_BASED_OUTPATIENT_CLINIC_OR_DEPARTMENT_OTHER): Payer: BC Managed Care – PPO

## 2012-08-19 ENCOUNTER — Emergency Department (HOSPITAL_BASED_OUTPATIENT_CLINIC_OR_DEPARTMENT_OTHER)
Admission: EM | Admit: 2012-08-19 | Discharge: 2012-08-19 | Disposition: A | Discharge status: Home / Self Care | Payer: BC Managed Care – PPO | Attending: Emergency Medicine | Admitting: Emergency Medicine

## 2012-08-19 DIAGNOSIS — IMO0002 Reserved for concepts with insufficient information to code with codable children: Secondary | ICD-10-CM | POA: Insufficient documentation

## 2012-08-19 DIAGNOSIS — S0510XA Contusion of eyeball and orbital tissues, unspecified eye, initial encounter: Secondary | ICD-10-CM | POA: Insufficient documentation

## 2012-08-19 DIAGNOSIS — Y9389 Activity, other specified: Secondary | ICD-10-CM | POA: Insufficient documentation

## 2012-08-19 DIAGNOSIS — S0990XA Unspecified injury of head, initial encounter: Secondary | ICD-10-CM

## 2012-08-19 DIAGNOSIS — Z862 Personal history of diseases of the blood and blood-forming organs and certain disorders involving the immune mechanism: Secondary | ICD-10-CM | POA: Insufficient documentation

## 2012-08-19 DIAGNOSIS — Z8719 Personal history of other diseases of the digestive system: Secondary | ICD-10-CM | POA: Insufficient documentation

## 2012-08-19 DIAGNOSIS — J45909 Unspecified asthma, uncomplicated: Secondary | ICD-10-CM | POA: Insufficient documentation

## 2012-08-19 DIAGNOSIS — Y9289 Other specified places as the place of occurrence of the external cause: Secondary | ICD-10-CM | POA: Insufficient documentation

## 2012-08-19 DIAGNOSIS — W2209XA Striking against other stationary object, initial encounter: Secondary | ICD-10-CM | POA: Insufficient documentation

## 2012-08-19 DIAGNOSIS — Z8639 Personal history of other endocrine, nutritional and metabolic disease: Secondary | ICD-10-CM | POA: Insufficient documentation

## 2012-08-19 DIAGNOSIS — Z79899 Other long term (current) drug therapy: Secondary | ICD-10-CM | POA: Insufficient documentation

## 2012-08-19 DIAGNOSIS — Z8659 Personal history of other mental and behavioral disorders: Secondary | ICD-10-CM | POA: Insufficient documentation

## 2012-08-19 NOTE — ED Notes (Signed)
Facial injury. States she was putting up the groceries and the handle came off the freezer hitting her in the face. She had blurred vision immediately afterward. Ice pack on arrival. Her left eyelid is swollen and her eye is red. Her vision has improved since the injury. She is alert and oriented.

## 2012-08-19 NOTE — ED Notes (Signed)
MD at bedside to assess pt now.

## 2012-08-19 NOTE — ED Provider Notes (Signed)
History     CSN: [id number]  Arrival date & time 08/19/12  1954   First MD Initiated Contact with Patient 08/19/12 2119      Chief Complaint  Patient presents with  . Facial Injury    (Consider location/radiation/quality/duration/timing/severity/associated sxs/prior treatment) Patient is a 37 y.o. female presenting with facial injury. The history is provided by the patient.  Facial Injury Associated symptoms: headaches   Associated symptoms: no epistaxis and no neck pain    patient shortly before arrival. The was putting away groceries and handle on a new freezer refrigerator came off striking her on the right side of the face for head and eye. Following that she had blurred vision that lasted until arrival here. Left eyelid is swollen there is some redness to her left eye vision has improved since the injury and is now back to normal. No nausea no vomiting no neck pain no other injuries. Patient does have a some bruising to the left eye. Pain currently is minimal at 3/10.  Past Medical History  Diagnosis Date  . Asthma, intermittent controlled     vs mild persistent  . Seasonal affective disorder     while living in [place]  . 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  Stomach bx-mild chronic gastritis, H pylori neg.  Duodenal bx normal.  . GERD (gastroesophageal reflux disease)   . Thyroiditis 6th grade    has been euthyroid x many years since this.  . 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  Marland Kitchen GAD (generalized anxiety disorder)     Past Surgical History  Procedure Laterality Date  . Tubal ligation  09/20/10    Hysteroscopic tubal ligation  . Esophagogastroduodenoscopy      Family History  Problem Relation Age of Onset  . Cancer Father      prostate and testicular  . Dementia Father     Died age 62 of Lewy body dementia  . Stroke Maternal Grandfather   . Stroke Paternal Grandfather     History  Substance Use  Topics  . Smoking status: Never Smoker   . Smokeless tobacco: Never Used  . Alcohol Use: No    OB History   Grav Para Term Preterm Abortions TAB SAB Ect Mult Living                  Review of Systems  Constitutional: Negative for fever.  HENT: Negative for nosebleeds, neck pain and tinnitus.   Eyes: Positive for redness and visual disturbance.  Respiratory: Negative for shortness of breath.   Cardiovascular: Negative for chest pain.  Gastrointestinal: Negative for abdominal pain.  Genitourinary: Negative for dysuria.  Musculoskeletal: Negative for back pain.  Skin: Positive for wound.  Neurological: Positive for headaches. Negative for speech difficulty.  Hematological: Does not bruise/bleed easily.  Psychiatric/Behavioral: Negative for confusion.    Allergies  Percocet and Sulfa antibiotics  Home Medications   Current Outpatient Rx  Name  Route  Sig  Dispense  Refill  . albuterol (PROAIR HFA) 108 (90 BASE) MCG/ACT inhaler   Inhalation   Inhale 2 puffs into the lungs every 6 (six) hours as needed for wheezing.   1 Inhaler   1   . traMADol (ULTRAM) 50 MG tablet      1-2 tabs po q6h prn pain   30 tablet   1     BP 128/84  Pulse 70  Temp(Src) 97.8 F (  36.6 C) (Oral)  Resp 16  Ht 5\' 9"  (1.753 m)  Wt 130 lb (58.968 kg)  BMI 19.19 kg/m2  SpO2 100%  Physical Exam  Nursing note and vitals reviewed. Constitutional: She is oriented to person, place, and time. She appears well-developed and well-nourished. No distress.  HENT:  Head: Normocephalic.  Left Ear: External ear normal.  Mouth/Throat: Oropharynx is clear and moist.  Normal except for around the left eye and for head with a little bit of abrasion and contusion he inferior part of the orbit on the cheek also has some swelling and abrasion. Slight redness I no hyphema. Left TM is normal. No step offs.  Eyes: Conjunctivae and EOM are normal. Pupils are equal, round, and reactive to light.  Neck: Normal  range of motion. Neck supple.  Cardiovascular: Normal rate, regular rhythm and normal heart sounds.   No murmur heard. Pulmonary/Chest: Effort normal and breath sounds normal.  Abdominal: Soft. Bowel sounds are normal. There is no tenderness.  Musculoskeletal: Normal range of motion.  Neurological: She is alert and oriented to person, place, and time. No cranial nerve deficit. She exhibits normal muscle tone. Coordination normal.  Skin: Skin is warm.    ED Course  Procedures (including critical care time)  Labs Reviewed - No data to display Ct Head Wo Contrast  08/19/2012   *RADIOLOGY REPORT*  Clinical Data:  Facial trauma.  CT HEAD WITHOUT CONTRAST CT MAXILLOFACIAL WITHOUT CONTRAST  Technique:  Multidetector CT imaging of the head and maxillofacial structures were performed using the standard protocol without intravenous contrast. Multiplanar CT image reconstructions of the maxillofacial structures were also generated.  Comparison:   None.  CT HEAD  Findings: The ventricles are normal.  No extra-axial fluid collections are seen.  The brainstem and cerebellum are unremarkable.  No acute intracranial findings such as infarction or hemorrhage.  No mass lesions.  The bony calvarium is intact. The visualized paranasal sinuses are clear.  A left mastoid effusion is noted.  The middle ear cavities are clear.  IMPRESSION: No acute intracranial findings or skull fracture. Left mastoid effusion.  CT MAXILLOFACIAL  Findings:   No acute facial bone fractures.  The orbital walls are intact.  The globes are normal.  No zygoma fracture.  Both mandibular condyles are normally located.  No mandible fracture.  Moderate deviation of the bony nasal septum rightward along with rightward spurring and narrowing of the right middle and inferior meatus.  IMPRESSION: No acute facial bone fractures.   Original Report Authenticated By: Rudie Meyer, M.D.   Ct Maxillofacial Wo Cm  08/19/2012   *RADIOLOGY REPORT*  Clinical  Data:  Facial trauma.  CT HEAD WITHOUT CONTRAST CT MAXILLOFACIAL WITHOUT CONTRAST  Technique:  Multidetector CT imaging of the head and maxillofacial structures were performed using the standard protocol without intravenous contrast. Multiplanar CT image reconstructions of the maxillofacial structures were also generated.  Comparison:   None.  CT HEAD  Findings: The ventricles are normal.  No extra-axial fluid collections are seen.  The brainstem and cerebellum are unremarkable.  No acute intracranial findings such as infarction or hemorrhage.  No mass lesions.  The bony calvarium is intact. The visualized paranasal sinuses are clear.  A left mastoid effusion is noted.  The middle ear cavities are clear.  IMPRESSION: No acute intracranial findings or skull fracture. Left mastoid effusion.  CT MAXILLOFACIAL  Findings:   No acute facial bone fractures.  The orbital walls are intact.  The globes are normal.  No zygoma fracture.  Both mandibular condyles are normally located.  No mandible fracture.  Moderate deviation of the bony nasal septum rightward along with rightward spurring and narrowing of the right middle and inferior meatus.  IMPRESSION: No acute facial bone fractures.   Original Report Authenticated By: Rudie Meyer, M.D.     1. Head injury, initial encounter       MDM  The patient now feeling better. I suspect she did have a mild concussion from the blow to her right fore head right eye area. No evidence of any facial fractures no evidence of any intracranial abnormalities on head CT.         Shelda Jakes, MD 08/19/12 [phone]

## 2012-08-19 NOTE — ED Notes (Signed)
Swelling to left eye

## 2012-08-19 NOTE — ED Notes (Signed)
Patient transported to CT 

## 2012-09-02 ENCOUNTER — Ambulatory Visit (INDEPENDENT_AMBULATORY_CARE_PROVIDER_SITE_OTHER): Payer: BC Managed Care – PPO | Admitting: Family Medicine

## 2012-09-02 ENCOUNTER — Encounter: Payer: Self-pay | Admitting: Family Medicine

## 2012-09-02 VITALS — BP 125/89 | HR 82 | Temp 97.9°F | Resp 14 | Wt 136.2 lb

## 2012-09-02 DIAGNOSIS — S060X0D Concussion without loss of consciousness, subsequent encounter: Secondary | ICD-10-CM

## 2012-09-02 DIAGNOSIS — H5710 Ocular pain, unspecified eye: Secondary | ICD-10-CM

## 2012-09-02 DIAGNOSIS — N912 Amenorrhea, unspecified: Secondary | ICD-10-CM

## 2012-09-02 DIAGNOSIS — Z5189 Encounter for other specified aftercare: Secondary | ICD-10-CM

## 2012-09-02 DIAGNOSIS — S060X0A Concussion without loss of consciousness, initial encounter: Secondary | ICD-10-CM | POA: Insufficient documentation

## 2012-09-02 DIAGNOSIS — H5712 Ocular pain, left eye: Secondary | ICD-10-CM
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
## 2012-09-09 NOTE — Assessment & Plan Note (Signed)
All symptoms have resolved. She currently has some mild left orbital pain w/out vision symptoms. Reassured pt.   Signs/symptoms to call or return for were reviewed and pt expressed understanding.

## 2013-02-03 ENCOUNTER — Other Ambulatory Visit: Payer: Self-pay

## 2015-08-08 ENCOUNTER — Telehealth: Payer: Self-pay

## 2015-08-08 ENCOUNTER — Encounter: Payer: Self-pay | Admitting: Family Medicine

## 2015-08-08 ENCOUNTER — Ambulatory Visit (INDEPENDENT_AMBULATORY_CARE_PROVIDER_SITE_OTHER): Payer: BLUE CROSS/BLUE SHIELD | Admitting: Family Medicine

## 2015-08-08 ENCOUNTER — Ambulatory Visit (HOSPITAL_BASED_OUTPATIENT_CLINIC_OR_DEPARTMENT_OTHER)
Admission: RE | Admit: 2015-08-08 | Discharge: 2015-08-08 | Disposition: A | Discharge status: Home / Self Care | Payer: BLUE CROSS/BLUE SHIELD | Source: Ambulatory Visit | Attending: Family Medicine | Admitting: Family Medicine

## 2015-08-08 VITALS — BP 121/86 | HR 79 | Temp 97.8°F | Ht 69.0 in | Wt 137.8 lb

## 2015-08-08 DIAGNOSIS — X58XXXA Exposure to other specified factors, initial encounter: Secondary | ICD-10-CM | POA: Insufficient documentation

## 2015-08-08 DIAGNOSIS — S93402A Sprain of unspecified ligament of left ankle, initial encounter: Secondary | ICD-10-CM | POA: Insufficient documentation

## 2015-08-08 NOTE — Progress Notes (Signed)
Pre visit review using our clinic review tool, if applicable. No additional management support is needed unless otherwise documented below in the visit note. 

## 2015-08-08 NOTE — Progress Notes (Signed)
OFFICE NOTE  08/08/2015  CC:  Chief Complaint  Patient presents with  . Ankle Injury    x1 week, fell over puppy, swelling- ice does help but does not alleviate     HPI: Patient is a 40 y.o. Caucasian female who is here for left ankle pain s/p twisting it 1 week ago tripping over the dog toys. Hurts still, swells still despite icing regularly.  No ankle support used.  Tylenol and advil being used but not regularly.  Pertinent PMH:  Past medical, surgical, social, and family history reviewed and no changes are noted since last office visit.  MEDS:  Outpatient Prescriptions Prior to Visit  Medication Sig Dispense Refill  . albuterol (PROAIR HFA) 108 (90 BASE) MCG/ACT inhaler Inhale 2 puffs into the lungs every 6 (six) hours as needed for wheezing. (Patient not taking: Reported on 08/08/2015) 1 Inhaler 1   No facility-administered medications prior to visit.    PE: Blood pressure 121/86, pulse 79, temperature 97.8 F (36.6 C), temperature source Oral, height 5\' 9"  (1.753 m), weight 137 lb 12.8 oz (62.506 kg), last menstrual period 07/25/2015, SpO2 97 %. Gen: Alert, well appearing.  Patient is oriented to person, place, time, and situation. AFFECT: pleasant, lucid thought and speech. Left calf squeeze elicits no pain. Left ankle w/out obvious soft tissue swelling.  No erythema, warmth, or ecchymoses. She has tenderness inferior to the lateral malleolus and more anteriorly over these ligaments.  No bony tenderness. ROM elicits pain but she can muster through it for the most part.  Talar tilt is too painful for me to push her through.  Anterior drawer reveals no excess laxiety.  Achilles is nontender.  IMPRESSION AND PLAN:  Left ankle lateral ankle sprain.  She has not been resting it, has used NSAIDs only sporadically, and she has not worn any support for her ankle. Will order ankle x-ray to r/o fx. We put her in L ankle lace up support in office today. I recommended 600mg   ibuprofen with food bid x 10d. Ice, rest.  Printed AVS with rehab exercises on it.  An After Visit Summary was printed and given to the patient.  FOLLOW UP: prn   Signed:  Santiago BumpersPhil Filemon Breton, MD           08/08/2015

## 2015-08-08 NOTE — Patient Instructions (Addendum)
Take 600 mg ibuprofen twice a day with food x 10 days.  Acute Ankle Sprain With Phase I Rehab An acute ankle sprain is a partial or complete tear in one or more of the ligaments of the ankle due to traumatic injury. The severity of the injury depends on both the number of ligaments sprained and the grade of sprain. There are 3 grades of sprains.   A grade 1 sprain is a mild sprain. There is a slight pull without obvious tearing. There is no loss of strength, and the muscle and ligament are the correct length.  A grade 2 sprain is a moderate sprain. There is tearing of fibers within the substance of the ligament where it connects two bones or two cartilages. The length of the ligament is increased, and there is usually decreased strength.  A grade 3 sprain is a complete rupture of the ligament and is uncommon. In addition to the grade of sprain, there are three types of ankle sprains.  Lateral ankle sprains: This is a sprain of one or more of the three ligaments on the outer side (lateral) of the ankle. These are the most common sprains. Medial ankle sprains: There is one large triangular ligament of the inner side (medial) of the ankle that is susceptible to injury. Medial ankle sprains are less common. Syndesmosis, "high ankle," sprains: The syndesmosis is the ligament that connects the two bones of the lower leg. Syndesmosis sprains usually only occur with very severe ankle sprains. SYMPTOMS  Pain, tenderness, and swelling in the ankle, starting at the side of injury that may progress to the whole ankle and foot with time.  "Pop" or tearing sensation at the time of injury.  Bruising that may spread to the heel.  Impaired ability to walk soon after injury. CAUSES   Acute ankle sprains are caused by trauma placed on the ankle that temporarily forces or pries the anklebone (talus) out of its normal socket.  Stretching or tearing of the ligaments that normally hold the joint in place  (usually due to a twisting injury). RISK INCREASES WITH:  Previous ankle sprain.  Sports in which the foot may land awkwardly (i.e., basketball, volleyball, or soccer) or walking or running on uneven or rough surfaces.  Shoes with inadequate support to prevent sideways motion when stress occurs.  Poor strength and flexibility.  Poor balance skills.  Contact sports. PREVENTION   Warm up and stretch properly before activity.  Maintain physical fitness:  Ankle and leg flexibility, muscle strength, and endurance.  Cardiovascular fitness.  Balance training activities.  Use proper technique and have a coach correct improper technique.  Taping, protective strapping, bracing, or high-top tennis shoes may help prevent injury. Initially, tape is best; however, it loses most of its support function within 10 to 15 minutes.  Wear proper-fitted protective shoes (High-top shoes with taping or bracing is more effective than either alone).  Provide the ankle with support during sports and practice activities for 12 months following injury. PROGNOSIS   If treated properly, ankle sprains can be expected to recover completely; however, the length of recovery depends on the degree of injury.  A grade 1 sprain usually heals enough in 5 to 7 days to allow modified activity and requires an average of 6 weeks to heal completely.  A grade 2 sprain requires 6 to 10 weeks to heal completely.  A grade 3 sprain requires 12 to 16 weeks to heal.  A syndesmosis sprain often takes more than  3 months to heal. RELATED COMPLICATIONS   Frequent recurrence of symptoms may result in a chronic problem. Appropriately addressing the problem the first time decreases the frequency of recurrence and optimizes healing time. Severity of the initial sprain does not predict the likelihood of later instability.  Injury to other structures (bone, cartilage, or tendon).  A chronically unstable or arthritic ankle joint  is a possibility with repeated sprains. TREATMENT Treatment initially involves the use of ice, medication, and compression bandages to help reduce pain and inflammation. Ankle sprains are usually immobilized in a walking cast or boot to allow for healing. Crutches may be recommended to reduce pressure on the injury. After immobilization, strengthening and stretching exercises may be necessary to regain strength and a full range of motion. Surgery is rarely needed to treat ankle sprains. MEDICATION   Nonsteroidal anti-inflammatory medications, such as aspirin and ibuprofen (do not take for the first 3 days after injury or within 7 days before surgery), or other minor pain relievers, such as acetaminophen, are often recommended. Take these as directed by your caregiver. Contact your caregiver immediately if any bleeding, stomach upset, or signs of an allergic reaction occur from these medications.  Ointments applied to the skin may be helpful.  Pain relievers may be prescribed as necessary by your caregiver. Do not take prescription pain medication for longer than 4 to 7 days. Use only as directed and only as much as you need. HEAT AND COLD  Cold treatment (icing) is used to relieve pain and reduce inflammation for acute and chronic cases. Cold should be applied for 10 to 15 minutes every 2 to 3 hours for inflammation and pain and immediately after any activity that aggravates your symptoms. Use ice packs or an ice massage.  Heat treatment may be used before performing stretching and strengthening activities prescribed by your caregiver. Use a heat pack or a warm soak. SEEK IMMEDIATE MEDICAL CARE IF:   Pain, swelling, or bruising worsens despite treatment.  You experience pain, numbness, discoloration, or coldness in the foot or toes.  New, unexplained symptoms develop (drugs used in treatment may produce side effects.) EXERCISES  PHASE I EXERCISES RANGE OF MOTION (ROM) AND STRETCHING EXERCISES  - Ankle Sprain, Acute Phase I, Weeks 1 to 2 These exercises may help you when beginning to restore flexibility in your ankle. You will likely work on these exercises for the 1 to 2 weeks after your injury. Once your physician, physical therapist, or athletic trainer sees adequate progress, he or she will advance your exercises. While completing these exercises, remember:   Restoring tissue flexibility helps normal motion to return to the joints. This allows healthier, less painful movement and activity.  An effective stretch should be held for at least 30 seconds.  A stretch should never be painful. You should only feel a gentle lengthening or release in the stretched tissue. RANGE OF MOTION - Dorsi/Plantar Flexion  While sitting with your right / left knee straight, draw the top of your foot upwards by flexing your ankle. Then reverse the motion, pointing your toes downward.  Hold each position for __________ seconds.  After completing your first set of exercises, repeat this exercise with your knee bent. Repeat __________ times. Complete this exercise __________ times per day.  RANGE OF MOTION - Ankle Alphabet  Imagine your right / left big toe is a pen.  Keeping your hip and knee still, write out the entire alphabet with your "pen." Make the letters as large as  you can without increasing any discomfort. Repeat __________ times. Complete this exercise __________ times per day.  STRENGTHENING EXERCISES - Ankle Sprain, Acute -Phase I, Weeks 1 to 2 These exercises may help you when beginning to restore strength in your ankle. You will likely work on these exercises for 1 to 2 weeks after your injury. Once your physician, physical therapist, or athletic trainer sees adequate progress, he or she will advance your exercises. While completing these exercises, remember:   Muscles can gain both the endurance and the strength needed for everyday activities through controlled exercises.  Complete  these exercises as instructed by your physician, physical therapist, or athletic trainer. Progress the resistance and repetitions only as guided.  You may experience muscle soreness or fatigue, but the pain or discomfort you are trying to eliminate should never worsen during these exercises. If this pain does worsen, stop and make certain you are following the directions exactly. If the pain is still present after adjustments, discontinue the exercise until you can discuss the trouble with your clinician. STRENGTH - Dorsiflexors  Secure a rubber exercise band/tubing to a fixed object (i.e., table, pole) and loop the other end around your right / left foot.  Sit on the floor facing the fixed object. The band/tubing should be slightly tense when your foot is relaxed.  Slowly draw your foot back toward you using your ankle and toes.  Hold this position for __________ seconds. Slowly release the tension in the band and return your foot to the starting position. Repeat __________ times. Complete this exercise __________ times per day.  STRENGTH - Plantar-flexors   Sit with your right / left leg extended. Holding onto both ends of a rubber exercise band/tubing, loop it around the ball of your foot. Keep a slight tension in the band.  Slowly push your toes away from you, pointing them downward.  Hold this position for __________ seconds. Return slowly, controlling the tension in the band/tubing. Repeat __________ times. Complete this exercise __________ times per day.  STRENGTH - Ankle Eversion  Secure one end of a rubber exercise band/tubing to a fixed object (table, pole). Loop the other end around your foot just before your toes.  Place your fists between your knees. This will focus your strengthening at your ankle.  Drawing the band/tubing across your opposite foot, slowly, pull your little toe out and up. Make sure the band/tubing is positioned to resist the entire motion.  Hold this  position for __________ seconds. Have your muscles resist the band/tubing as it slowly pulls your foot back to the starting position.  Repeat __________ times. Complete this exercise __________ times per day.  STRENGTH - Ankle Inversion  Secure one end of a rubber exercise band/tubing to a fixed object (table, pole). Loop the other end around your foot just before your toes.  Place your fists between your knees. This will focus your strengthening at your ankle.  Slowly, pull your big toe up and in, making sure the band/tubing is positioned to resist the entire motion.  Hold this position for __________ seconds.  Have your muscles resist the band/tubing as it slowly pulls your foot back to the starting position. Repeat __________ times. Complete this exercises __________ times per day.  STRENGTH - Towel Curls  Sit in a chair positioned on a non-carpeted surface.  Place your right / left foot on a towel, keeping your heel on the floor.  Pull the towel toward your heel by only curling your toes. Keep  your heel on the floor.  If instructed by your physician, physical therapist, or athletic trainer, add weight to the end of the towel. Repeat __________ times. Complete this exercise __________ times per day.   This information is not intended to replace advice given to you by your health care provider. Make sure you discuss any questions you have with your health care provider.   Document Released: 10/16/2004 Document Revised: 04/07/2014 Document Reviewed: 06/29/2008 Elsevier Interactive Patient Education [employer]

## 2015-08-08 NOTE — Telephone Encounter (Signed)
Spoke to patient. Gave x-ray results. Patient verbalized understanding.

## 2015-08-08 NOTE — Telephone Encounter (Signed)
-----   Message from Courtney MassedPhilip H McGowen, MD sent at 08/08/2015  4:51 PM EDT ----- Pls notify pt that her x-ray showed NO FRACTURE--thx

## 2016-03-31 DIAGNOSIS — I8393 Asymptomatic varicose veins of bilateral lower extremities: Secondary | ICD-10-CM

## 2016-03-31 HISTORY — DX: Asymptomatic varicose veins of bilateral lower extremities: I83.93

## 2016-08-26 ENCOUNTER — Ambulatory Visit (INDEPENDENT_AMBULATORY_CARE_PROVIDER_SITE_OTHER): Payer: BLUE CROSS/BLUE SHIELD | Admitting: Family Medicine

## 2016-08-26 ENCOUNTER — Encounter: Payer: Self-pay | Admitting: Family Medicine

## 2016-08-26 VITALS — BP 114/80 | HR 78 | Temp 97.9°F | Resp 16 | Ht 69.0 in | Wt 144.5 lb

## 2016-08-26 DIAGNOSIS — H9201 Otalgia, right ear: Secondary | ICD-10-CM

## 2016-08-26 DIAGNOSIS — J0100 Acute maxillary sinusitis, unspecified: Secondary | ICD-10-CM | POA: Diagnosis not present

## 2016-08-26 DIAGNOSIS — J301 Allergic rhinitis due to pollen: Secondary | ICD-10-CM

## 2016-08-26 MED ORDER — CEPHALEXIN 500 MG PO CAPS: 500.0000 mg | ORAL_CAPSULE | Freq: Three times a day (TID) | ORAL | 0 refills | Status: DC

## 2016-08-26 MED ORDER — PREDNISONE 20 MG PO TABS: ORAL_TABLET | ORAL | 0 refills | Status: DC

## 2016-08-26 NOTE — Progress Notes (Signed)
OFFICE VISIT  08/26/2016   CC:  Chief Complaint  Patient presents with  . Ear Pain    right x 1 day   HPI:    Patient is a 41 y.o. Caucasian female who presents for right ear pain.   Onset yesterday of R ear pain.  Left ear "clicky" and somewhat full. Has had nasal congestion all of spring allergy season.  Zyrtec D helping minimally. Pain around eyes.  No maxillary pain, no upper teeth pain.  Minimal PND cough.  Flonase gave her HAs. No fevers.  Allergy sx's worse the last 4-5 days.    Past Medical History:  Diagnosis Date  . 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  . Asthma, intermittent controlled    vs mild persistent  . GAD (generalized anxiety disorder)   . GERD (gastroesophageal reflux disease)   . 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  Stomach bx-mild chronic gastritis, H pylori neg.  Duodenal bx normal.  . Seasonal affective disorder (HCC)    while living in [place]  . Thyroiditis 6th grade   has been euthyroid x many years since this.    Past Surgical History:  Procedure Laterality Date  . ESOPHAGOGASTRODUODENOSCOPY    . TUBAL LIGATION  09/20/10   Hysteroscopic tubal ligation    Outpatient Medications Prior to Visit  Medication Sig Dispense Refill  . albuterol (PROAIR HFA) 108 (90 BASE) MCG/ACT inhaler Inhale 2 puffs into the lungs every 6 (six) hours as needed for wheezing. 1 Inhaler 1   No facility-administered medications prior to visit.     Allergies  Allergen Reactions  . Percocet [Oxycodone-Acetaminophen] Other (See Comments)    Restlessness  . Sulfa Antibiotics     ROS As per HPI  PE: Blood pressure 114/80, pulse 78, temperature 97.9 F (36.6 C), temperature source Oral, resp. rate 16, height 5\' 9"  (1.753 m), weight 144 lb 8 oz (65.5 kg), last menstrual period 08/14/2016, SpO2 100 %. VS: noted--normal. Gen: alert, NAD, NONTOXIC APPEARING. HEENT: eyes without injection, drainage, or  swelling.  Ears: EACs clear, TMs retracted bilat, with normal light reflex and landmarks.  Left pinna with a bit of focal erythema, mild TTP.  Nose: Clear rhinorrhea, with some dried, crusty exudate adherent to mildly injected mucosa.  No purulent d/c.  Mild bilat paranasal sinus TTP.  No facial swelling.  Throat and mouth without focal lesion.  No pharyngial swelling, erythema, or exudate.  Some clear PND noted, with cobblestoning of posterior pharynx mucosa. Neck: supple, no LAD.   LUNGS: CTA bilat, nonlabored resps.   CV: RRR, no m/r/g. EXT: no c/c/e SKIN: no rash    LABS:    Chemistry      Component Value Date/Time   NA 140 11/25/2011 1009   K 4.7 11/25/2011 1009   CL 103 11/25/2011 1009   CO2 30 11/25/2011 1009   BUN 8 11/25/2011 1009   CREATININE 0.9 11/25/2011 1009      Component Value Date/Time   CALCIUM 9.8 11/25/2011 1009   ALKPHOS 87 11/25/2011 1009   AST 28 11/25/2011 1009   ALT 22 11/25/2011 1009   BILITOT 0.6 11/25/2011 1009      IMPRESSION AND PLAN:  1) Allergic rhinitis, uncontrolled, with superimposed acute sinusitis. Right and left ear symptoms due to eustachian tube dysfunction assoc with these conditions. Right ear focal erythema and tenderness could be early cellulitis. Will treat with cephalexin 500 mg tid x  10d. Also, prednisone 40 mg qd x 5d. Instructions: Try over the counter nasacort or rhinocort nasal steroid spray for chronic allergy treatment (must use daily for this to be effective).  An After Visit Summary was printed and given to the patient.  FOLLOW UP: Return if symptoms worsen or fail to improve.  Signed:  Santiago BumpersPhil Shaunak Kreis, MD           08/26/2016

## 2016-08-26 NOTE — Patient Instructions (Signed)
Try over the counter nasacort or rhinocort nasal steroid spray for chronic allergy treatment (must use daily for this to be effective).

## 2016-10-24 ENCOUNTER — Ambulatory Visit (INDEPENDENT_AMBULATORY_CARE_PROVIDER_SITE_OTHER): Payer: BLUE CROSS/BLUE SHIELD | Admitting: Family Medicine

## 2016-10-24 ENCOUNTER — Encounter: Payer: Self-pay | Admitting: Family Medicine

## 2016-10-24 VITALS — BP 122/82 | HR 96 | Temp 98.0°F | Resp 16 | Ht 69.0 in | Wt 144.5 lb

## 2016-10-24 DIAGNOSIS — J0101 Acute recurrent maxillary sinusitis: Secondary | ICD-10-CM | POA: Diagnosis not present

## 2016-10-24 DIAGNOSIS — J209 Acute bronchitis, unspecified: Secondary | ICD-10-CM | POA: Diagnosis not present

## 2016-10-24 MED ORDER — PREDNISONE 20 MG PO TABS: ORAL_TABLET | ORAL | 0 refills | Status: DC

## 2016-10-24 MED ORDER — AMOXICILLIN-POT CLAVULANATE 875-125 MG PO TABS: 1.0000 | ORAL_TABLET | Freq: Two times a day (BID) | ORAL | 0 refills | Status: DC

## 2016-10-24 NOTE — Progress Notes (Signed)
OFFICE VISIT  10/24/2016   CC:  Chief Complaint  Patient presents with  . URI   HPI:    Patient is a 41 y.o. Caucasian female who presents for respiratory symptoms. Has hx of mild intermittent vs mild persistent asthma. Describes double sickening: >10d/a nasal congestion, HA, sinus/ear pressure.  Sx's lasted a couple days, then felt better a few days, then all the URI sx's returned, body with mild diffuse aching, pain in R maxillary and periorbital region, lots of coughing.  NO SOB or wheezing except when having a coughing fit.  No ST. At beginning of illness she describes subjective fever, but none since then. Tylenol, ibupr, zyrtec D, mucinex all tried but no help.    Past Medical History:  Diagnosis Date  . 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  . Asthma, intermittent controlled    vs mild persistent  . GAD (generalized anxiety disorder)   . GERD (gastroesophageal reflux disease)   . 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  Stomach bx-mild chronic gastritis, H pylori neg.  Duodenal bx normal.  . Seasonal affective disorder (HCC)    while living in [place]  . Thyroiditis 6th grade   has been euthyroid x many years since this.    Past Surgical History:  Procedure Laterality Date  . ESOPHAGOGASTRODUODENOSCOPY    . TUBAL LIGATION  09/20/10   Hysteroscopic tubal ligation    Outpatient Medications Prior to Visit  Medication Sig Dispense Refill  . albuterol (PROAIR HFA) 108 (90 BASE) MCG/ACT inhaler Inhale 2 puffs into the lungs every 6 (six) hours as needed for wheezing. 1 Inhaler 1  . cephALEXin (KEFLEX) 500 MG capsule Take 1 capsule (500 mg total) by mouth 3 (three) times daily. (Patient not taking: Reported on 10/24/2016) 30 capsule 0  . predniSONE (DELTASONE) 20 MG tablet 2 tabs po qd x 5d (Patient not taking: Reported on 10/24/2016) 10 tablet 0   No facility-administered medications prior to visit.     Allergies   Allergen Reactions  . Percocet [Oxycodone-Acetaminophen] Other (See Comments)    Restlessness  . Sulfa Antibiotics     ROS As per HPI  PE: Blood pressure 122/82, pulse 96, temperature 98 F (36.7 C), temperature source Oral, resp. rate 16, height 5\' 9"  (1.753 m), weight 144 lb 8 oz (65.5 kg), last menstrual period 10/08/2016, SpO2 99 %. VS: noted--normal. Gen: alert, NAD, NONTOXIC APPEARING. HEENT: eyes without injection, drainage, or swelling.  Ears: EACs clear, TMs with normal light reflex and landmarks--R TM retracted.  Nose: Clear rhinorrhea, with some dried, crusty exudate adherent to mildly injected mucosa.  No purulent d/c.  R>>L paranasal sinus and frontal sinus TTP.  No facial swelling.  Throat and mouth without focal lesion.  No pharyngial swelling, erythema, or exudate.   Neck: supple, no LAD.   LUNGS: CTA bilat, nonlabored resps.   CV: RRR, no m/r/g. EXT: no c/c/e SKIN: no rash   LABS:    Chemistry      Component Value Date/Time   NA 140 11/25/2011 1009   K 4.7 11/25/2011 1009   CL 103 11/25/2011 1009   CO2 30 11/25/2011 1009   BUN 8 11/25/2011 1009   CREATININE 0.9 11/25/2011 1009      Component Value Date/Time   CALCIUM 9.8 11/25/2011 1009   ALKPHOS 87 11/25/2011 1009   AST 28 11/25/2011 1009   ALT 22 11/25/2011 1009   BILITOT 0.6 11/25/2011  1009       IMPRESSION AND PLAN:  Acute recurrent maxillary sinusitis + acute bronchitis. Augmentin 875mg  bid x 10d. Prednisone 40mg  qd x 5d. Get otc generic robitussin DM OR Mucinex DM and use as directed on the packaging for cough and congestion. Use otc generic saline nasal spray 2-3 times per day to irrigate/moisturize your nasal passages.  An After Visit Summary was printed and given to the patient.  FOLLOW UP: Return if symptoms worsen or fail to improve.  Signed:  Santiago BumpersPhil Keavon Sensing, MD           10/24/2016

## 2016-10-24 NOTE — Patient Instructions (Signed)
Get otc generic robitussin DM OR Mucinex DM and use as directed on the packaging for cough and congestion. Use otc generic saline nasal spray 2-3 times per day to irrigate/moisturize your nasal passages.   

## 2016-10-29 ENCOUNTER — Encounter: Payer: Self-pay | Admitting: Family Medicine

## 2017-01-06 ENCOUNTER — Ambulatory Visit (INDEPENDENT_AMBULATORY_CARE_PROVIDER_SITE_OTHER): Payer: BLUE CROSS/BLUE SHIELD | Admitting: Family Medicine

## 2017-01-06 ENCOUNTER — Encounter: Payer: Self-pay | Admitting: Family Medicine

## 2017-01-06 VITALS — BP 116/80 | HR 62 | Temp 98.1°F | Resp 20 | Ht 69.0 in | Wt 140.0 lb

## 2017-01-06 DIAGNOSIS — R319 Hematuria, unspecified: Secondary | ICD-10-CM | POA: Diagnosis not present

## 2017-01-06 DIAGNOSIS — R350 Frequency of micturition: Secondary | ICD-10-CM

## 2017-01-06 DIAGNOSIS — Z23 Encounter for immunization: Secondary | ICD-10-CM | POA: Diagnosis not present

## 2017-01-06 LAB — POC URINALSYSI DIPSTICK (AUTOMATED)
BILIRUBIN UA: NEGATIVE
GLUCOSE UA: NEGATIVE
KETONES UA: NEGATIVE
Nitrite, UA: NEGATIVE
Protein, UA: NEGATIVE
SPEC GRAV UA: 1.02 (ref 1.010–1.025)
Urobilinogen, UA: 0.2 E.U./dL
pH, UA: 5.5 (ref 5.0–8.0)

## 2017-01-06 MED ORDER — CEPHALEXIN 500 MG PO CAPS: 500.0000 mg | ORAL_CAPSULE | Freq: Four times a day (QID) | ORAL | 0 refills | Status: DC

## 2017-01-06 NOTE — Progress Notes (Signed)
Courtney Mullins , Jun 04, 1975, 41 y.o., female MRN: [id number] Patient Care Team    Relationship Specialty Notifications Start End  McGowen, Maryjean Morn, MD PCP - General Family Medicine  09/11/11   Consuela Mimes, MD Consulting Physician Family Medicine  10/29/16    Comment: Vascular/vein    Chief Complaint  Patient presents with  . Urinary Frequency     Subjective: Pt presents for an OV with complaints of urinary frequency, Dysuria and odor of urine of 2 days duration.  Patient denies fever, chills, nausea, vomit or lower back pain.Pt has tried nothing to ease their symptoms.   Depression screen PHQ 2/9 01/06/2017  Decreased Interest 0  Down, Depressed, Hopeless 0  PHQ - 2 Score 0    Allergies  Allergen Reactions  . Percocet [Oxycodone-Acetaminophen] Other (See Comments)    Restlessness  . Sulfa Antibiotics    Social History  Substance Use Topics  . Smoking status: Never Smoker  . Smokeless tobacco: Never Used  . Alcohol use No   Past Medical History:  Diagnosis Date  . 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  . Asthma, intermittent controlled    vs mild persistent  . GAD (generalized anxiety disorder)   . GERD (gastroesophageal reflux disease)   . 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  Stomach bx-mild chronic gastritis, H pylori neg.  Duodenal bx normal.  . Seasonal affective disorder (HCC)    while living in [place]  . Thyroiditis 6th grade   has been euthyroid x many years since this.  . Varicose veins of both lower extremities 2018   with superficial venous insufficiency   Past Surgical History:  Procedure Laterality Date  . ESOPHAGOGASTRODUODENOSCOPY    . TUBAL LIGATION  09/20/10   Hysteroscopic tubal ligation   Family History  Problem Relation Age of Onset  . Cancer Father         prostate and testicular  . Dementia Father        Died age 50 of Lewy body dementia  . Stroke Maternal Grandfather     . Stroke Paternal Grandfather    Allergies as of 01/06/2017      Reactions   Percocet [oxycodone-acetaminophen] Other (See Comments)   Restlessness   Sulfa Antibiotics       Medication List       Accurate as of 01/06/17  9:48 AM. Always use your most recent med list.          albuterol 108 (90 Base) MCG/ACT inhaler Commonly known as:  PROAIR HFA Inhale 2 puffs into the lungs every 6 (six) hours as needed for wheezing.       All past medical history, surgical history, allergies, family history, immunizations andmedications were updated in the EMR today and reviewed under the history and medication portions of their EMR.     ROS: Negative, with the exception of above mentioned in HPI   Objective:  BP 116/80 (BP Location: Right Arm, Patient Position: Sitting, Cuff Size: Normal)   Pulse 62   Temp 98.1 F (36.7 C)   Resp 20   Ht  (1.753 m)   Wt 140 lb (63.5 kg)   LMP 12/18/2016   SpO2 97%   BMI 20.67 kg/m  Body mass index is 20.67 kg/m. Gen: Afebrile. No acute distress. Nontoxic in appearance, well developed, well nourished.  HENT: AT. [place].  MMM, no oral lesions.  Eyes:Pupils Equal Round Reactive  to light, Extraocular movements intact,  Conjunctiva without redness, discharge or icterus. CV: RRR  Chest: CTAB, no wheeze or crackles.  Abd: Soft. Flat. ND. Mild suprapubic pressure. BS present.  MSK: No CVA tenderness bilaterally. Neuro:  Normal gait. PERLA. EOMi. Alert. Oriented x3  No exam data present No results found. Results for orders placed or performed in visit on 01/06/17 (from the past 24 hour(s))  POCT Urinalysis Dipstick (Automated)     Status: Abnormal   Collection Time: 01/06/17  9:42 AM  Result Value Ref Range   Color, UA yellow    Clarity, UA clear    Glucose, UA negative    Bilirubin, UA negative    Ketones, UA negative    Spec Grav, UA 1.020 1.010 - 1.025   Blood, UA trace-lysed    pH, UA 5.5 5.0 - 8.0   Protein, UA negative     Urobilinogen, UA 0.2 0.2 or 1.0 E.U./dL   Nitrite, UA negative    Leukocytes, UA Trace (A) Negative    Assessment/Plan: Courtney Mullins is a 41 y.o. female present for OV for  Urinary frequency/Hematuria - History of present illness and exam consistent with UTI. Push fluids. Keflex 500 mg 4 times a day prescribed for 7 days. - POCT Urinalysis Dipstick (Automated): With trace leukocytes and trace blood only. - Urine Culture sent. Influenza vaccine administered - Flu Vaccine QUAD 6+ mos PF IM (Fluarix Quad PF)    Reviewed expectations re: course of current medical issues.  Discussed self-management of symptoms.  Outlined signs and symptoms indicating need for more acute intervention.  Patient verbalized understanding and all questions were answered.  Patient received an After-Visit Summary.    Orders Placed This Encounter  Procedures  . Flu Vaccine QUAD 6+ mos PF IM (Fluarix Quad PF)  . POCT Urinalysis Dipstick (Automated)     Note is dictated utilizing voice recognition software. Although note has been proof read prior to signing, occasional typographical errors still can be missed. If any questions arise, please do not hesitate to call for verification.   electronically signed by:  Felix Pacini, DO  [facility] [place]

## 2017-01-06 NOTE — Patient Instructions (Signed)
Keflex prescribed every 6 hours for 7 days.  Hydrate with water.    Urinary Tract Infection, Adult A urinary tract infection (UTI) is an infection of any part of the urinary tract. The urinary tract includes the:  Kidneys.  Ureters.  Bladder.  Urethra.  These organs make, store, and get rid of pee (urine) in the body. Follow these instructions at home:  Take over-the-counter and prescription medicines only as told by your doctor.  If you were prescribed an antibiotic medicine, take it as told by your doctor. Do not stop taking the antibiotic even if you start to feel better.  Avoid the following drinks: ? Alcohol. ? Caffeine. ? Tea. ? Carbonated drinks.  Drink enough fluid to keep your pee clear or pale yellow.  Keep all follow-up visits as told by your doctor. This is important.  Make sure to: ? Empty your bladder often and completely. Do not to hold pee for long periods of time. ? Empty your bladder before and after sex. ? Wipe from front to back after a bowel movement if you are female. Use each tissue one time when you wipe. Contact a doctor if:  You have back pain.  You have a fever.  You feel sick to your stomach (nauseous).  You throw up (vomit).  Your symptoms do not get better after 3 days.  Your symptoms go away and then come back. Get help right away if:  You have very bad back pain.  You have very bad lower belly (abdominal) pain.  You are throwing up and cannot keep down any medicines or water. This information is not intended to replace advice given to you by your health care provider. Make sure you discuss any questions you have with your health care provider. Document Released: 09/03/2007 Document Revised: 08/23/2015 Document Reviewed: 02/05/2015 Elsevier Interactive Patient Education  [employer].

## 2017-01-09 LAB — URINE CULTURE
MICRO NUMBER: 81126409
SPECIMEN QUALITY: ADEQUATE

## 2017-02-23 ENCOUNTER — Telehealth: Payer: Self-pay

## 2017-02-23 NOTE — Telephone Encounter (Signed)
[facility] Client TELEPHONE ADVICE RECORD [facility] Patient Name: Courtney Mullins Gender: Female DOB: 08/21/1975 Age: [age] Y 6 M 22 D Return Phone Number: [phone] (Primary) Address: City/State/Zip: [place] Client [facility] Client Site [facility] Physician Santiago BumpersMcGowen, Phil - MD Contact Type Call Who Is Calling Patient / Member / Family / Caregiver Call Type Triage / Clinical Relationship To Patient Self Return Phone Number [phone] (Primary) Chief Complaint Urination Pain Reason for Call Symptomatic / Request for Health Information Initial Comment Caller states that she is in [place] and thinks that she has a UTI, wanted to know if a nurse can call it in. Additional Comment Caller is going to have to leave soon, needs [facility] to [facility] [place]. [phone] [address] [place] [id number] Translation No No Triage Reason Patient declined Nurse Assessment Nurse: Shelva MajesticWest, RN, [place] Date/Time Lamount Cohen(Eastern Time): 02/20/2017 8:39:14 AM Confirm and document reason for call. If symptomatic, describe symptoms. ---[place]er [place] in [place] won't be back into town until Tuesday. Had a UTI a month ago, on buses all day, cannot go to urgent care. Burning/pain with urination began yesterday on airplane. Informed caller I can not contact the on call for any medications after hours, there are no standing orders for any UTI like symptoms that I can call in for her either, caller asked about any offices that are opened today, informed caller the [facility] office is from 9-1 but I am not sure if they can handle medications either, but she could try. Didn't want to go over symptoms today. Does the patient have any new or worsening symptoms? ---Yes Will a triage be completed? ---No Select reason for no triage. ---Patient declined Nurse: Shelva MajesticWest, RN, [place] Date/Time Lamount Cohen(Eastern Time):  02/20/2017 8:47:24 AM Please select the assessment type ---Verbal order / New medication order Does the client directives allow for assistance with medications after hours? ---No Guidelines Guideline Title Affirmed Question Affirmed Notes Nurse Date/Time (Eastern Time) Disp. Time Lamount Cohen(Eastern Time) Disposition Final User 02/20/2017 8:47:55 AM Call Completed Shelva MajesticWest, RN, [place] PLEASE NOTE: All timestamps contained within this report are represented as [place] Standard Time. CONFIDENTIALTY NOTICE: This fax transmission is intended only for the addressee. It contains information that is legally privileged, confidential or otherwise protected from use or disclosure. If you are not the intended recipient, you are strictly prohibited from reviewing, disclosing, copying using or disseminating any of this information or taking any action in reliance on or regarding this information. If you have received this fax in error, please notify us immediately by telephone so that we can arrange for its return to us. Phone: [phone], Toll-Free: [phone], Fax: [phone] Page: 2 of 2 Call Id: [id number] 02/20/2017 8:47:46 AM Clinical Call Yes Shelva MajesticWest, RN, [place]

## 2017-02-23 NOTE — Telephone Encounter (Signed)
Pt needs to be seen. If still out of town needs to be seen by Urgent Care, if back home needs to schedule office visit.   Left message for pt to call back.

## 2017-02-26 NOTE — Telephone Encounter (Signed)
SW pt and she stated that [place]

## 2017-03-31 DIAGNOSIS — N921 Excessive and frequent menstruation with irregular cycle: Secondary | ICD-10-CM

## 2017-03-31 HISTORY — DX: Excessive and frequent menstruation with irregular cycle: N92.1

## 2018-01-28 ENCOUNTER — Encounter: Payer: Self-pay | Admitting: Family Medicine

## 2018-01-28 ENCOUNTER — Ambulatory Visit: Payer: BLUE CROSS/BLUE SHIELD | Admitting: Family Medicine

## 2018-01-28 ENCOUNTER — Ambulatory Visit: Payer: Self-pay | Admitting: *Deleted

## 2018-01-28 VITALS — BP 124/84 | HR 86 | Temp 98.2°F | Resp 18 | Ht 69.0 in | Wt 152.2 lb

## 2018-01-28 DIAGNOSIS — R091 Pleurisy: Secondary | ICD-10-CM

## 2018-01-28 DIAGNOSIS — M546 Pain in thoracic spine: Secondary | ICD-10-CM | POA: Diagnosis not present

## 2018-01-28 DIAGNOSIS — J0100 Acute maxillary sinusitis, unspecified: Secondary | ICD-10-CM | POA: Diagnosis not present

## 2018-01-28 MED ORDER — NAPROXEN 500 MG PO TABS
500.0000 mg | ORAL_TABLET | Freq: Two times a day (BID) | ORAL | 0 refills | Status: DC
Start: 2018-01-28 — End: 2018-04-08

## 2018-01-28 MED ORDER — AMOXICILLIN-POT CLAVULANATE 875-125 MG PO TABS: 1.0000 | ORAL_TABLET | Freq: Two times a day (BID) | ORAL | 0 refills | Status: DC

## 2018-01-28 NOTE — Patient Instructions (Addendum)
I believe your symptoms started with a viral infection and has progressed to a bacteria sinusitis. It also seems you have some pleurisy symptoms as well (information below). Complete the treatment explained below. Cause for emergent follow up would be shortness of breath, fever, rapid heart beat or chest pain with activity.  I would also recommend you start OTC Prilosec for 12 weeks. This will help with GERD like symptoms.  Rest, hydrate.  OTC: +/- flonase, mucinex (DM if cough),  nasal saline.  Augmentin  prescribed, take until completed.  Naproxen prescribed every 12 hours with food for 5 days- to help with inflammation and muscle complaint. Heating pad to your back can be helpful as well.  If cough present it can last up to 6-8 weeks.  F/U 2 weeks with your PCP if  of not improved.    Pleurisy Pleurisy, also called pleuritis, is irritation and swelling (inflammation) of the linings of the lungs. The linings of the lungs are called pleura. They cover the outside of the lungs and the inside of the chest wall. There is a small amount of fluid (pleural fluid) between the pleura that allows the lungs to move in and out smoothly when you breathe. Pleurisy causes the pleura to be rough and dry and to rub together when you breathe, which is painful. In some cases, pleurisy can cause pleural fluid to build up between the pleura (pleural effusion). What are the causes? Common causes of this condition include:  A lung infection caused by bacteria or a virus.  A blood clot that travels to the lung (pulmonary embolism).  Air leaking into the pleural space (pneumothorax).  Lung cancer or a lung tumor.  A chest injury.  Diseases that can cause lung inflammation. These include rheumatoid arthritis, lupus, sickle cell disease, inflammatory bowel disease, and pancreatitis.  Heart or chest surgery.  Lung damage from inhaling asbestos.  A lung reaction to certain medicines.  Sometimes the cause is  unknown. What are the signs or symptoms? Chest pain is the main symptom of this condition. The pain is usually on one side. Chest pain may start suddenly and be sharp or stabbing. It may become a constant dull ache. You may also feel pain in your back or shoulder. The pain may get worse when you cough, take deep breaths, or make sudden movements. Other symptoms may include:  Shortness of breath.  Noisy breathing (wheezing).  Cough.  Chills.  Fever.  How is this diagnosed? This condition may be diagnosed based on:  Your medical history.  Your symptoms.  A physical exam. Your health care provider will listen to your breathing with a stethoscope to check for a rough, rubbing sound (friction rub). If you have pleural effusion, your breathing sounds may be muffled.  Tests, such as: ? Blood tests to check for infections or diseases and to measure the oxygen in your blood. ? Imaging studies of your lungs. These may include a chest X-ray, ultrasound, MRI, or CT scan. ? A procedure to remove pleural fluid with a needle for testing (thoracentesis).  How is this treated? Treatment for this condition depends on the cause. Pleurisy that was caused by a virus usually clears up within 2 weeks. Treatment for pleurisy may include:  NSAIDs to help relieve pain and swelling.  Antibiotic medicines, if your condition was caused by a bacterial infection.  Prescription pain or cough medicine.  Follow these instructions at home: Medicines  Take over-the-counter and prescription medicines only as told  by your health care provider.  If you were prescribed an antibiotic, take it as told by your health care provider. Do not stop taking the antibiotic even if you start to feel better. Activity  Rest and return to your normal activities as told by your health care provider. Ask your health care provider what activities are safe for you.  Do not drive or use heavy machinery while taking prescription  pain medicine. General instructions  Monitor your pleurisy for any changes.  Take deep breaths often, even if it is painful. This can help prevent lung infection (pneumonia) and collapse of lung tissue (atelectasis).  When lying down, lie on your painful side. This may reduce pain.  Do not smoke. If you need help quitting, ask your health care provider.  Keep all follow-up visits as told by your health care provider. This is important. Contact a health care provider if:  You have pain that: ? Gets worse. ? Does not get better with medicine. ? Lasts for more than 1 week.  You have a fever or chills.  Your cough or shortness of breath is not improving at home.  You cough up pus-like (purulent) secretions. Get help right away if:  Your lips, fingernails, or toenails darken or turn blue.  You cough up blood.  You have any of the following symptoms that get worse: ? Difficulty breathing. ? Shortness of breath. ? Wheezing.  You have pain that spreads into your neck, arms, or jaw.  You develop a rash.  You vomit.  You faint. Summary  Pleurisy is inflammation of the linings of the lungs (pleura).  Pleurisy causes pain that makes it difficult for you to breathe or cough.  Pleurisy is often caused by an underlying infection or disease.  Treatment of pleurisy depends on the cause, and it often includes medicines. This information is not intended to replace advice given to you by your health care provider. Make sure you discuss any questions you have with your health care provider. Document Released: 03/17/2005 Document Revised: 12/10/2015 Document Reviewed: 12/10/2015 Elsevier Interactive Patient Education  2017 [employer].

## 2018-01-28 NOTE — Telephone Encounter (Addendum)
Patient is complaining of chest pain R and back pain. She also complains of ear ache.  Patient states she has been having chest pain with "burpy" symptoms. She has been having GI symptoms with it for several weeks. Patient states she has been traveling and has recently been flying.  Patient also states she has been having ear ache in her R ear that she can not get to go away. She feels she may have pressure from fluid behind the ear.   Reason for Disposition . [1] Patient claims chest pain is same as previously diagnosed "heartburn" AND [2] describes burning in chest AND [3] accompanying sour taste in mouth  Answer Assessment - Initial Assessment Questions 1. LOCATION: "Where does it hurt?"       Center of sternum to back- burping/heating pad helps 2. RADIATION: "Does the pain go anywhere else?" (e.g., into neck, jaw, arms, back)     Back only 3. ONSET: "When did the chest pain begin?" (Minutes, hours or days)      Saturday- end of day is worse 4. PATTERN "Does the pain come and go, or has it been constant since it started?"  "Does it get worse with exertion?"      Comes and goes- GI issues, does not get worse with exertion 5. DURATION: "How long does it last" (e.g., seconds, minutes, hours)     hours 6. SEVERITY: "How bad is the pain?"  (e.g., Scale 1-10; mild, moderate, or severe)    - MILD (1-3): doesn't interfere with normal activities     - MODERATE (4-7): interferes with normal activities or awakens from sleep    - SEVERE (8-10): excruciating pain, unable to do any normal activities       moderate 7. CARDIAC RISK FACTORS: "Do you have any history of heart problems or risk factors for heart disease?" (e.g., prior heart attack, angina; high blood pressure, diabetes, being overweight, high cholesterol, smoking, or strong family history of heart disease)     no 8. PULMONARY RISK FACTORS: "Do you have any history of lung disease?"  (e.g., blood clots in lung, asthma, emphysema, birth  control pills)     no 9. CAUSE: "What do you think is causing the chest pain?"     GI related- "burpy", GI distress 10. OTHER SYMPTOMS: "Do you have any other symptoms?" (e.g., dizziness, nausea, vomiting, sweating, fever, difficulty breathing, cough)       no 11. PREGNANCY: "Is there any chance you are pregnant?" "When was your last menstrual period?"       BTL-  LMP- 01/18/18  Answer Assessment - Initial Assessment Questions 1. LOCATION: "Which ear is involved?"     R ear pain 2. ONSET: "When did the ear start hurting"      3 weeks ago- pain comes and goes 3. SEVERITY: "How bad is the pain?"  (Scale 1-10; mild, moderate or severe)   - MILD (1-3): doesn't interfere with normal activities    - MODERATE (4-7): interferes with normal activities or awakens from sleep    - SEVERE (8-10): excruciating pain, unable to do any normal activities      3-4 4. URI SYMPTOMS: " Do you have a runny nose or cough?"     Zyrtec and Benadryl- patient has been traveling- flying 5. FEVER: "Do you have a fever?" If so, ask: "What is your temperature, how was it measured, and when did it start?"     no 6. CAUSE: "Have you been swimming recently?", "  How often do you use Q-TIPS?", "Have you had any recent air travel or scuba diving?"     Air travel- sinus issues 7. OTHER SYMPTOMS: "Do you have any other symptoms?" (e.g., headache, stiff neck, dizziness, vomiting, runny nose, decreased hearing)     Stuffy nose- sinus drainage, chest discomfort 8. PREGNANCY: "Is there any chance you are pregnant?" "When was your last menstrual period?"     BTL  Protocols used: CHEST PAIN-A-AH, EARACHE-A-AH

## 2018-01-28 NOTE — Progress Notes (Signed)
Courtney Mullins , September 29, 1975, 42 y.o., female MRN: [id number] Patient Care Team    Relationship Specialty Notifications Start End  McGowen, Maryjean Morn, MD PCP - General Family Medicine  09/11/11   Consuela Mimes, MD Consulting Physician Family Medicine  10/29/16    Comment: Vascular/vein    Chief Complaint  Patient presents with  . Ear Pain    sinus pain,drainage  . Chest Pain    started Saturday     Subjective: Pt presents for an OV with multiple system complaints. She states she has had right ear discomfort for 2 weeks. She has been flying to [place] a few times over the last few months to see her son and she figured it could have been from the flight. She has also been watching football outside (in the snow) in [place] recently. Since that time she has seen a progression in her symptoms that include hoarseness, sore throat, right maxillary sinus pain, mild occasional wheeze, upper GI/heart burn, right sided lung/breast discomfort, right thoracic discomfort (focal area) and diarrhea x1. She denies nausea, vomit, decreased appetite, shortness of breath, dizziness, diaphorsis, left sided chest pain or radiation of pain.  H/o asthma. She has not needed her inhaler.H/o of recent travel.  Pt has tried zyrtec and benadryl to ease their symptoms. Benadryl was more helpful, but made her sleepy. She does endorse feeling a little better today.   Depression screen PHQ 2/9 01/06/2017  Decreased Interest 0  Down, Depressed, Hopeless 0  PHQ - 2 Score 0    Allergies  Allergen Reactions  . Percocet [Oxycodone-Acetaminophen] Other (See Comments)    Restlessness  . Sulfa Antibiotics    Social History   Tobacco Use  . Smoking status: Never Smoker  . Smokeless tobacco: Never Used  Substance Use Topics  . Alcohol use: No   Past Medical History:  Diagnosis Date  . 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  . Asthma, intermittent controlled    vs mild persistent  . GAD (generalized anxiety  disorder)   . GERD (gastroesophageal reflux disease)   . 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  Stomach bx-mild chronic gastritis, H pylori neg.  Duodenal bx normal.  . Seasonal affective disorder (HCC)    while living in [place]  . Thyroiditis 6th grade   has been euthyroid x many years since this.  . Varicose veins of both lower extremities 2018   with superficial venous insufficiency   Past Surgical History:  Procedure Laterality Date  . ESOPHAGOGASTRODUODENOSCOPY    . TUBAL LIGATION  09/20/10   Hysteroscopic tubal ligation   Family History  Problem Relation Age of Onset  . Cancer Father         prostate and testicular  . Dementia Father        Died age 15 of Lewy body dementia  . Stroke Maternal Grandfather   . Stroke Paternal Grandfather    Allergies as of 01/28/2018      Reactions   Percocet [oxycodone-acetaminophen] Other (See Comments)   Restlessness   Sulfa Antibiotics       Medication List        Accurate as of 01/28/18 10:23 AM. Always use your most recent med list.          albuterol 108 (90 Base) MCG/ACT inhaler Commonly known as:  PROVENTIL HFA;VENTOLIN HFA Inhale 2 puffs into the lungs every 6 (six) hours as needed for wheezing.  All past medical history, surgical history, allergies, family history, immunizations andmedications were updated in the EMR today and reviewed under the history and medication portions of their EMR.     ROS: Negative, with the exception of above mentioned in HPI   Objective:  BP 124/84 (BP Location: Left Arm, Patient Position: Sitting, Cuff Size: Normal)   Pulse 86   Temp 98.2 F (36.8 C)   Resp 18   Ht 5\' 9"  (1.753 m)   Wt 152 lb 4 oz (69.1 kg)   SpO2 98%   BMI 22.48 kg/m  Body mass index is 22.48 kg/m. Gen: Afebrile. No acute distress. Nontoxic in appearance, well developed, well nourished. Appears well.  HENT: AT. [place]. Bilateral TM visualized mild fullness  bilaterally, no erythema. MMM, no oral lesions. Bilateral nares with erythema, drainage present. Throat without erythema or exudates. No cough, mild hoarseness.  Eyes:Pupils Equal Round Reactive to light, Extraocular movements intact,  Conjunctiva without redness, discharge or icterus. Neck/lymp/endocrine: Supple,no lymphadenopathy CV: RRR no murmur, no edema Chest: CTAB, no wheeze or crackles. Good air movement, normal resp effort.  Abd: Soft. flat. NTND. BS present. no Masses palpated. No rebound or guarding.  MSK: no erythema, no skin changes or rash, very mild tissue texture change/spasm on exam, mild TTP over this area right thoracic area.  Neuro:  Normal gait. PERLA. EOMi. Alert. Oriented x3  No exam data present No results found. No results found for this or any previous visit (from the past 24 hour(s)).  Assessment/Plan: Courtney Mullins is a 42 y.o. female present for OV for  Acute right-sided thoracic back pain Her back was tender to touch, suggesting MSK origin. Rest, heat application, massage. Naproxen prescribed for scheduled BID use with food for 5-7 days   Acute non-recurrent maxillary sinusitis/Pleurisy Pts exam is consistent with sinus infection and possibly pleurisy. She had multiple systemic complaints and concerns, that she was worried  was connected to MI/sinus/GB/thyroid/breast cancer. I do not feel all her complaints are connected.  I also considered PE with her traveling history. Given her exam, HPI and the fact she is not on hormone replacement- I feel this is low risk. Return precautions was discussed and she is aware to seek emergent treatment if her symptoms progress. She agreed w/ plan. Rest, hydrate.  OTC: +/- flonase, mucinex (DM if cough),  nasal saline.  Start OTC Prilosec for 2 weeks.  Augmentin  prescribed, take until completed.  Naproxen prescribed every 12 hours with food for 5 days- to help with inflammation and muscle complaint. Heating pad to your back  can be helpful as well.  If cough present it can last up to 6-8 weeks.  F/U 2 weeks with your PCP if  of not improved.   Pt was encouraged to follow up with her PCP for her chronic conditions and CPE   Reviewed expectations re: course of current medical issues.  Discussed self-management of symptoms.  Outlined signs and symptoms indicating need for more acute intervention.  Patient verbalized understanding and all questions were answered.  Patient received an After-Visit Summary.    No orders of the defined types were placed in this encounter.  > 25 minutes spent with patient, >50% of time spent face to face   Note is dictated utilizing voice recognition software. Although note has been proof read prior to signing, occasional typographical errors still can be missed. If any questions arise, please do not hesitate to call for verification.   electronically signed by:  Howard Pouch, DO  [facility] [place]

## 2018-04-08 ENCOUNTER — Ambulatory Visit (INDEPENDENT_AMBULATORY_CARE_PROVIDER_SITE_OTHER): Payer: BLUE CROSS/BLUE SHIELD | Admitting: Family Medicine

## 2018-04-08 ENCOUNTER — Other Ambulatory Visit (HOSPITAL_COMMUNITY)
Admission: RE | Admit: 2018-04-08 | Discharge: 2018-04-08 | Disposition: A | Discharge status: Home / Self Care | Payer: BLUE CROSS/BLUE SHIELD | Source: Ambulatory Visit | Attending: Family Medicine | Admitting: Family Medicine

## 2018-04-08 ENCOUNTER — Encounter: Payer: Self-pay | Admitting: Family Medicine

## 2018-04-08 VITALS — BP 117/79 | HR 79 | Temp 98.0°F | Resp 16 | Ht 68.5 in | Wt 152.2 lb

## 2018-04-08 DIAGNOSIS — N926 Irregular menstruation, unspecified: Secondary | ICD-10-CM

## 2018-04-08 DIAGNOSIS — L811 Chloasma: Secondary | ICD-10-CM

## 2018-04-08 DIAGNOSIS — N921 Excessive and frequent menstruation with irregular cycle: Secondary | ICD-10-CM

## 2018-04-08 DIAGNOSIS — Z1239 Encounter for other screening for malignant neoplasm of breast: Secondary | ICD-10-CM

## 2018-04-08 DIAGNOSIS — Z124 Encounter for screening for malignant neoplasm of cervix: Secondary | ICD-10-CM

## 2018-04-08 DIAGNOSIS — N852 Hypertrophy of uterus: Secondary | ICD-10-CM | POA: Diagnosis not present

## 2018-04-08 DIAGNOSIS — Z0000 Encounter for general adult medical examination without abnormal findings: Secondary | ICD-10-CM

## 2018-04-08 LAB — LIPID PANEL
CHOL/HDL RATIO: 4
CHOLESTEROL: 198 mg/dL (ref 0–200)
HDL: 54.2 mg/dL (ref >39.00)
LDL CALC: 131 mg/dL — AB (ref 0–99)
NonHDL: 144.16
Triglycerides: 64 mg/dL (ref 0.0–149.0)
VLDL: 12.8 mg/dL (ref 0.0–40.0)

## 2018-04-08 LAB — CBC WITH DIFFERENTIAL/PLATELET
Basophils Absolute: 0.1 10*3/uL (ref 0.0–0.1)
Basophils Relative: 1.3 % (ref 0.0–3.0)
EOS ABS: 0.1 10*3/uL (ref 0.0–0.7)
Eosinophils Relative: 1.6 % (ref 0.0–5.0)
HEMATOCRIT: 47.3 % — AB (ref 36.0–46.0)
Hemoglobin: 16.3 g/dL — ABNORMAL HIGH (ref 12.0–15.0)
LYMPHS ABS: 2.1 10*3/uL (ref 0.7–4.0)
LYMPHS PCT: 37 % (ref 12.0–46.0)
MCHC: 34.4 g/dL (ref 30.0–36.0)
MCV: 95 fl (ref 78.0–100.0)
Monocytes Absolute: 0.4 10*3/uL (ref 0.1–1.0)
Monocytes Relative: 7.1 % (ref 3.0–12.0)
NEUTROS ABS: 2.9 10*3/uL (ref 1.4–7.7)
Neutrophils Relative %: 53 % (ref 43.0–77.0)
Platelets: 250 10*3/uL (ref 150.0–400.0)
RBC: 4.98 Mil/uL (ref 3.87–5.11)
RDW: 12.1 % (ref 11.5–15.5)
WBC: 5.6 10*3/uL (ref 4.0–10.5)

## 2018-04-08 LAB — COMPREHENSIVE METABOLIC PANEL
ALT: 13 U/L (ref 0–35)
AST: 16 U/L (ref 0–37)
Albumin: 4.7 g/dL (ref 3.5–5.2)
Alkaline Phosphatase: 74 U/L (ref 39–117)
BUN: 11 mg/dL (ref 6–23)
CHLORIDE: 104 meq/L (ref 96–112)
CO2: 25 meq/L (ref 19–32)
CREATININE: 0.89 mg/dL (ref 0.40–1.20)
Calcium: 10 mg/dL (ref 8.4–10.5)
GFR: 74.4 mL/min (ref >60.00)
Glucose, Bld: 90 mg/dL (ref 70–99)
Potassium: 4.4 mEq/L (ref 3.5–5.1)
SODIUM: 137 meq/L (ref 135–145)
Total Bilirubin: 0.7 mg/dL (ref 0.2–1.2)
Total Protein: 7.5 g/dL (ref 6.0–8.3)

## 2018-04-08 LAB — LUTEINIZING HORMONE: LH: 5.02 m[IU]/mL

## 2018-04-08 LAB — HM PAP SMEAR: HM Pap smear: NEGATIVE

## 2018-04-08 LAB — POCT URINE PREGNANCY: Preg Test, Ur: NEGATIVE

## 2018-04-08 LAB — TSH: TSH: 1.62 u[IU]/mL (ref 0.35–4.50)

## 2018-04-08 LAB — FOLLICLE STIMULATING HORMONE: FSH: 4.8 m[IU]/mL

## 2018-04-08 NOTE — Patient Instructions (Signed)

## 2018-04-08 NOTE — Progress Notes (Signed)
Office Note 04/08/2018  CC:  Chief Complaint  Patient presents with  . Annual Exam    Pt is fasting.     HPI:  Courtney Mullins is a 43 y.o. White female who is here for health maintenance exam plus GYN exam/cerv ca screen. Due to some scheduling confusion, it appears she also thought she was going to discuss several new concerns she was having.  We did discuss this some today: 1) She asks for her hormones to be checked, worried about this b/c she went to her dermatologist about some melasma that has returned lately on her face and he told her to ask me to check hormone levels b/c melasma is ordinarily only associated with pregnancy.  Her LMP was 03/18/18.  She does not think she is pregnant. She is also concerned b/c her menstrual periods are getting more problematic; they are spacing out some in interval, having a bit more clotting with bleeding, has premenstrual dysphoric sx's now as well, cramping is more severe than in the past.  She does state that in the past she was put on OCPs continuously for this and it helped.  She also complains of a rash she gets on her neck and some on her face lately, thinks it may be connected to ingestion of some kind of food but nothing seems to be consistent regarding this potential pattern.  The rash feels hot but not itchy.  She showed me pictures today and it appears to be a coalesced urticarial type rash on anterior and lateral aspect of neck and lower portion of face.  No swelling of lips, tongue, gums, or eyes.  No wheezing or SOB.  Cervical ca screening:  No hx of abnl paps. No FH of cerv ca. LMP 03/18/18. She is not a smoker.    Past Medical History:  Diagnosis Date  . 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  . Asthma, intermittent controlled    vs mild persistent  . GAD (generalized anxiety disorder)   . GERD (gastroesophageal reflux disease)   . 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   Stomach bx-mild chronic gastritis, H pylori neg.  Duodenal bx normal.  . Seasonal affective disorder (HCC)    while living in [place]  . Thyroiditis 6th grade   has been euthyroid x many years since this.  . Varicose veins of both lower extremities 2018   with superficial venous insufficiency    Past Surgical History:  Procedure Laterality Date  . ESOPHAGOGASTRODUODENOSCOPY    . TUBAL LIGATION  09/20/10   Hysteroscopic tubal ligation    Family History  Problem Relation Age of Onset  . Cancer Father         prostate and testicular  . Dementia Father        Died age 41 of Lewy body dementia  . Stroke Maternal Grandfather   . Stroke Paternal Grandfather     Social History   Socioeconomic History  . Marital status: Married    Spouse name: Clifton Custardaron  . Number of children: 1  . Years of education: Not on file  . Highest education level: Not on file  Occupational History  . Not on file  Social Needs  . Financial resource strain: Not on file  . Food insecurity:    Worry: Not on file    Inability: Not on file  . Transportation needs:    Medical: Not on file  Non-medical: Not on file  Tobacco Use  . Smoking status: Never Smoker  . Smokeless tobacco: Never Used  Substance and Sexual Activity  . Alcohol use: No  . Drug use: No  . Sexual activity: Not on file  Lifestyle  . Physical activity:    Days per week: Not on file    Minutes per session: Not on file  . Stress: Not on file  Relationships  . Social connections:    Talks on phone: Not on file    Gets together: Not on file    Attends religious service: Not on file    Active member of club or organization: Not on file    Attends meetings of clubs or organizations: Not on file    Relationship status: Not on file  . Intimate partner violence:    Fear of current or ex partner: Not on file    Emotionally abused: Not on file    Physically abused: Not on file    Forced sexual activity: Not on file  Other Topics  Concern  . Not on file  Social History Narrative   Married, one 41 y/o son.   [place]ig from [place].   Stay home mom.  Husband works for [employer].   No T/A/Ds       Outpatient Medications Prior to Visit  Medication Sig Dispense Refill  . albuterol (PROAIR HFA) 108 (90 BASE) MCG/ACT inhaler Inhale 2 puffs into the lungs every 6 (six) hours as needed for wheezing. 1 Inhaler 1  . amoxicillin-clavulanate (AUGMENTIN) 875-125 MG tablet Take 1 tablet by mouth 2 (two) times daily. (Patient not taking: Reported on 04/08/2018) 20 tablet 0  . naproxen (NAPROSYN) 500 MG tablet Take 1 tablet (500 mg total) by mouth 2 (two) times daily with a meal. (Patient not taking: Reported on 04/08/2018) 30 tablet 0   No facility-administered medications prior to visit.     Allergies  Allergen Reactions  . Percocet [Oxycodone-Acetaminophen] Other (See Comments)    Restlessness  . Sulfa Antibiotics     ROS Review of Systems  Constitutional: Negative for appetite change, chills, fatigue and fever.  HENT: Negative for congestion, dental problem, ear pain and sore throat.   Eyes: Negative for discharge, redness and visual disturbance.  Respiratory: Negative for cough, chest tightness, shortness of breath and wheezing.   Cardiovascular: Negative for chest pain, palpitations and leg swelling.  Gastrointestinal: Negative for abdominal pain, blood in stool, diarrhea, nausea and vomiting.  Genitourinary: Negative for difficulty urinating, dysuria, flank pain, frequency, hematuria and urgency.  Musculoskeletal: Negative for arthralgias, back pain, joint swelling, myalgias and neck stiffness.  Skin: Negative for pallor and rash.  Neurological: Negative for dizziness, speech difficulty, weakness and headaches.  Hematological: Negative for adenopathy. Does not bruise/bleed easily.  Psychiatric/Behavioral: Negative for confusion and sleep disturbance. The patient is not nervous/anxious.     PE; Blood pressure 117/79,  pulse 79, temperature 98 F (36.7 C), temperature source Oral, resp. rate 16, height 5' 8.5" (1.74 m), weight 152 lb 4 oz (69.1 kg), last menstrual period 03/18/2018, SpO2 100 %. Pt examined with Faythe GheeLisa Albright, CMA, as chaperone.  Gen: Alert, well appearing.  Patient is oriented to person, place, time, and situation. AFFECT: pleasant, lucid thought and speech. ENT: Ears: EACs clear, normal epithelium.  TMs with good light reflex and landmarks bilaterally.  Eyes: no injection, icteris, swelling, or exudate.  EOMI, PERRLA. Nose: no drainage or turbinate edema/swelling.  No injection or focal lesion.  Mouth: lips without  lesion/swelling.  Oral mucosa pink and moist.  Dentition intact and without obvious caries or gingival swelling.  Oropharynx without erythema, exudate, or swelling.  Neck: supple/nontender.  No LAD, mass, or TM.  Carotid pulses 2+ bilaterally, without bruits. CV: RRR, no m/r/g.   LUNGS: CTA bilat, nonlabored resps, good aeration in all lung fields. ABD: soft, NT, ND, BS normal.  No hepatospenomegaly or mass.  No bruits. EXT: no clubbing, cyanosis, or edema.  Musculoskeletal: no joint swelling, erythema, warmth, or tenderness.  ROM of all joints intact. Skin - no sores or suspicious lesions.  She has faint patches of hyperpigmentation on face. Vagina and vulva are normal;  no discharge is noted.  Cervix normal except I see a sliver of deep pink endocervical mucosa protruding through the os.. Uterus anteverted and mobile, enlarged-->fundus palpable 3 finger breadths above pubic symphysis, without tenderness.  Adnexa normal in size without masses or tenderness. Pap Smear - is completed today. Exam chaperoned by female assistant.   Pertinent labs:  Lab Results  Component Value Date   TSH 2.22 11/25/2011   Lab Results  Component Value Date   WBC 4.8 11/25/2011   HGB 14.6 11/25/2011   HCT 44.1 11/25/2011   MCV 95.9 11/25/2011   PLT 191.0 11/25/2011   Lab Results  Component  Value Date   CREATININE 0.9 11/25/2011   BUN 8 11/25/2011   NA 140 11/25/2011   K 4.7 11/25/2011   CL 103 11/25/2011   CO2 30 11/25/2011   Lab Results  Component Value Date   ALT 22 11/25/2011   AST 28 11/25/2011   ALKPHOS 87 11/25/2011   BILITOT 0.6 11/25/2011   No results found for: CHOL No results found for: HDL No results found for: LDLCALC No results found for: TRIG No results found for: CHOLHDL No results found for: HGDJ2E  ASSESSMENT AND PLAN:   1) Melasma: as per derm, nothing much to do for this. Will check estradiol, progesterone, FSH, and LH today. UPT neg.  2) Metromenorrhagia, with PMD sx's as well. UPT neg. Mild interval and bleeding amount change could be related to her enlarged uterus--potentially fibroid uterus. Will obtain transabdominal and transvaginal ultrasounds. No OCP at this time, but if pap normal and no endometrial hyperplasia seen on pelvic u/s then I'll start ocp.  3) Uterine enlargement:  UPT neg. Pelvic u/s's ordered for further evaluation. May ultimately need GYN referral.  4) Health maintenance exam: Reviewed age and gender appropriate health maintenance issues (prudent diet, regular exercise, health risks of tobacco and excessive alcohol, use of seatbelts, fire alarms in home, use of sunscreen).  Also reviewed age and gender appropriate health screening as well as vaccine recommendations. Vaccines: flu vaccine-->UTD 11/2017. Labs: FLP, CMET, CBC, estradiol, progesterone, FSH, LH today. Cervical ca screening: pap + HPV today. Breast ca screening: needs screening mammogram--> ordered today. Colon ca screening: average risk patient-> as per latest guidelines, start screening at 22 yrs of age.  An After Visit Summary was printed and given to the patient.  FOLLOW UP:  Return for 7-10 day f/u hormone.  Signed:  Santiago Bumpers, MD           04/08/2018

## 2018-04-09 ENCOUNTER — Ambulatory Visit (INDEPENDENT_AMBULATORY_CARE_PROVIDER_SITE_OTHER): Payer: BLUE CROSS/BLUE SHIELD

## 2018-04-09 ENCOUNTER — Encounter: Payer: Self-pay | Admitting: Family Medicine

## 2018-04-09 ENCOUNTER — Other Ambulatory Visit: Payer: Self-pay | Admitting: Family Medicine

## 2018-04-09 ENCOUNTER — Other Ambulatory Visit (INDEPENDENT_AMBULATORY_CARE_PROVIDER_SITE_OTHER): Payer: BLUE CROSS/BLUE SHIELD

## 2018-04-09 DIAGNOSIS — N852 Hypertrophy of uterus: Secondary | ICD-10-CM

## 2018-04-09 DIAGNOSIS — D751 Secondary polycythemia: Secondary | ICD-10-CM | POA: Diagnosis not present

## 2018-04-09 LAB — FERRITIN: Ferritin: 63.9 ng/mL (ref 10.0–291.0)

## 2018-04-09 LAB — IBC PANEL
IRON: 148 ug/dL — AB (ref 42–145)
Saturation Ratios: 50.6 % — ABNORMAL HIGH (ref 20.0–50.0)
TRANSFERRIN: 209 mg/dL — AB (ref 212.0–360.0)

## 2018-04-09 LAB — ESTRADIOL: Estradiol: 164 pg/mL

## 2018-04-09 LAB — PROGESTERONE: Progesterone: 11.9 ng/mL

## 2018-04-09 MED ORDER — NORETHINDRONE ACET-ETHINYL EST 1-20 MG-MCG PO TABS: ORAL_TABLET | ORAL | 3 refills | Status: DC

## 2018-04-12 LAB — CYTOLOGY - PAP
DIAGNOSIS: NEGATIVE
HPV (WINDOPATH): NOT DETECTED

## 2018-04-12 LAB — HM PAP SMEAR: HM PAP: NEGATIVE

## 2018-04-14 ENCOUNTER — Ambulatory Visit: Payer: Self-pay | Admitting: *Deleted

## 2018-04-14 ENCOUNTER — Telehealth: Payer: Self-pay | Admitting: Family Medicine

## 2018-04-14 ENCOUNTER — Encounter: Payer: Self-pay | Admitting: Family Medicine

## 2018-04-14 NOTE — Telephone Encounter (Signed)
FYI

## 2018-04-14 NOTE — Telephone Encounter (Signed)
Noted  

## 2018-04-14 NOTE — Telephone Encounter (Signed)
See lab result note.

## 2018-04-14 NOTE — Telephone Encounter (Signed)
Copied from CRM [id number]. Topic: Quick Communication - See Telephone Encounter >> Apr 14, 2018  3:30 PM Terisa Starr wrote: CRM for notification. See Telephone encounter for: 04/14/18.  Requesting results from 1/10

## 2018-04-14 NOTE — Telephone Encounter (Signed)
Pt reports just received call regarding lab results. States she forgot to tell nurse she had a stomach virus 1 week prior to lab draw, was unable to stay hydrated. States had vomiting and diarrhea up to day before draw.  States she think this may have added to abnormal results.  Reason for Disposition . [1] Caller requesting NON-URGENT health information AND [2] PCP's office is the best resource  Answer Assessment - Initial Assessment Questions 1. REASON FOR CALL or QUESTION: "What is your reason for calling today?" or "How can I best help you?" or "What question do you have that I can help answer?"     Response to lab results  Protocols used: INFORMATION ONLY CALL-A-AH

## 2018-04-15 ENCOUNTER — Ambulatory Visit (INDEPENDENT_AMBULATORY_CARE_PROVIDER_SITE_OTHER): Payer: BLUE CROSS/BLUE SHIELD

## 2018-04-15 DIAGNOSIS — Z1239 Encounter for other screening for malignant neoplasm of breast: Secondary | ICD-10-CM

## 2018-04-15 DIAGNOSIS — R928 Other abnormal and inconclusive findings on diagnostic imaging of breast: Secondary | ICD-10-CM | POA: Diagnosis not present

## 2018-04-15 HISTORY — DX: Other abnormal and inconclusive findings on diagnostic imaging of breast: R92.8

## 2018-04-16 ENCOUNTER — Other Ambulatory Visit: Payer: Self-pay | Admitting: Family Medicine

## 2018-04-16 DIAGNOSIS — R928 Other abnormal and inconclusive findings on diagnostic imaging of breast: Secondary | ICD-10-CM

## 2018-04-21 ENCOUNTER — Ambulatory Visit
Admission: RE | Admit: 2018-04-21 | Discharge: 2018-04-21 | Disposition: A | Discharge status: Home / Self Care | Payer: BLUE CROSS/BLUE SHIELD | Source: Ambulatory Visit | Attending: Family Medicine | Admitting: Family Medicine

## 2018-04-21 DIAGNOSIS — R928 Other abnormal and inconclusive findings on diagnostic imaging of breast: Secondary | ICD-10-CM

## 2018-04-21 HISTORY — DX: Other abnormal and inconclusive findings on diagnostic imaging of breast: R92.8

## 2018-05-04 ENCOUNTER — Ambulatory Visit: Payer: Self-pay | Admitting: *Deleted

## 2018-05-04 NOTE — Telephone Encounter (Signed)
Summary: Medication Management   Pt stated she is on her second pack of birth control and she has been spotting just about every day. She would like to know if she needs a different BC or high dose of hormones. Please advise. CB# [phone]     Patient is calling to report that she has taken the first pack of pills and is very pleased- her symptoms are nonexistent. She is reporting very light spotting/show daily with the first pack and now that she has started the second pack- it has picked up to where she is having to use liner. This would be her period week- so she may have increased spotting. She is to continue pills- usually have patient's try to get through 3 cycles/packs to see if they have good response- especially using continuous method.She does need to notifiy if she has period type bleeding for over 7 days- or if this bleeding continues through the end of third pack of pills.  Reason for Disposition . Irregular or unexpected bleeding or spotting  Answer Assessment - Initial Assessment Questions 1. TYPE: "What is the name of the birth control pill you are using?"     Lo Estrin 1/20 generic 2. PACK TYPE: "How do you take your birth control pill?"   - 28-Day Cycle/Pack: Takes an active hormone pill days 1-21 and placebo pill on days 21-28   - 28-Day Cycle/21-Day Pack: Takes an active hormone pill days 1-21, followed by a pill-free week   - 9-Month Cycle/Pack: Takes an active pill for 3 months, followed by pill free week or placebo week   - Continuous: Takes an active hormone pill every day, continuously     continuous 3. INITIATION: "When did you first start taking this birth control pill?"     1 month ago- just started second pack 4. SYMPTOM: "What is the main symptom (or question) you're concerned about?"     break through bleeding 5. ONSET: "When did the bleeding start?"     Couple days after started pills- very light spotting 6. VAGINAL BLEEDING: "Are you having any unusual  vaginal bleeding?"   - NONE   - SPOTTING: spotting or pinkish / brownish mucous discharge; does not fill panty-liner or pad   - MILD: less than 1 pad / hour; less than patient's usual menstrual bleeding   - MODERATE: 1-2 pads / hour; small-medium blood clots (e.g., pea, grape, small coin)   - SEVERE: soaking 2 or more pads/hour for 2 or more hours; bleeding not contained by pads or  tampons     Spotting- tablespoon- liner only 7. PAIN: "Is there any pain?" (Scale: 1-10; mild, moderate, severe).     no 8. MISSED/LATE PILLS: "Did you miss or take any pills late?" If yes, "When? How many pills?"     No missed pills 9. PREGNANCY: "Are you concerned you might be pregnant?" "When was your last menstrual  period?"     n/a  Protocols used: CONTRACEPTION - BIRTH CONTROL PILLS - COMBINED-A-AH

## 2018-05-04 NOTE — Telephone Encounter (Signed)
I recommend she go through 3 complete cycles and then make f/u appt in office to recheck/discuss.-thx

## 2018-05-04 NOTE — Telephone Encounter (Signed)
Pt advised and voiced understanding.   

## 2018-05-04 NOTE — Telephone Encounter (Signed)
Please advise. Thanks.  

## 2018-05-10 ENCOUNTER — Ambulatory Visit: Payer: Self-pay | Admitting: *Deleted

## 2018-05-10 NOTE — Telephone Encounter (Signed)
  Reason for Disposition . [1] Follow-up call to recent contact AND [2] information only call, no triage required  Answer Assessment - Initial Assessment Questions 1. REASON FOR CALL or QUESTION: "What is your reason for calling today?" or "How can I best help you?" or "What question do you have that I can help answer?"     Has bleeding/clots with newly started [facility]. See previous TE with Dr. Milinda Cave.  Protocols used: INFORMATION ONLY CALL-A-AH

## 2018-05-10 NOTE — Telephone Encounter (Signed)
Patient called to follow up with last TC with office RN. She is on the 2nd week of 2nd pack of norethindrone-ethinyl estradiol birth control.  See previous TE regarding Dr. Samul Dada advice. She continues to have vaginal bleeding and some clots at this time. Had some cramping that lasted 1-1.5 days which she reported is less time than she  previously endured with her cycle. Reiterated Dr. Samul Dada recommendation to go through 3 complete cycles and them make a f/u to discuss. Advised if any changes occurred with any worsening symptoms during this time to call back.

## 2018-05-11 NOTE — Telephone Encounter (Signed)
Noted  

## 2018-11-29 ENCOUNTER — Other Ambulatory Visit: Payer: Self-pay | Admitting: Family Medicine

## 2019-01-27 ENCOUNTER — Other Ambulatory Visit: Payer: Self-pay | Admitting: Family Medicine

## 2019-03-15 ENCOUNTER — Other Ambulatory Visit: Payer: Self-pay | Admitting: Family Medicine

## 2019-03-15 NOTE — Telephone Encounter (Signed)
RF request for Junel  LOV:04/08/18 CPE Next ov: advised to f/u 7-10 d to discuss Last written: 01/27/19 (21,2)  Please Advise. Medication pending

## 2019-05-21 ENCOUNTER — Other Ambulatory Visit: Payer: Self-pay | Admitting: Family Medicine

## 2019-07-21 ENCOUNTER — Other Ambulatory Visit: Payer: Self-pay | Admitting: Family Medicine

## 2019-08-02 ENCOUNTER — Other Ambulatory Visit: Payer: Self-pay | Admitting: Family Medicine

## 2019-08-19 ENCOUNTER — Other Ambulatory Visit: Payer: Self-pay | Admitting: Family Medicine

## 2019-08-19 NOTE — Telephone Encounter (Signed)
Contacted patient to schedule because she is overdue for appointment.  She states they have moved to [place] and she hasn't found a physician yet.  Asked if you could please send in 30 day supply while she looks for new MD?

## 2019-09-08 ENCOUNTER — Other Ambulatory Visit: Payer: Self-pay | Admitting: Family Medicine

## 2020-01-13 IMAGING — MG DIGITAL SCREENING BILATERAL MAMMOGRAM WITH TOMO AND CAD
6 of 10 series · 6 of 30 positions shown · non-contrast
Comparison: Previous exam(s).

CLINICAL DATA: Screening.

EXAM:
DIGITAL SCREENING BILATERAL MAMMOGRAM WITH TOMO AND CAD

[R CC synth-2D]
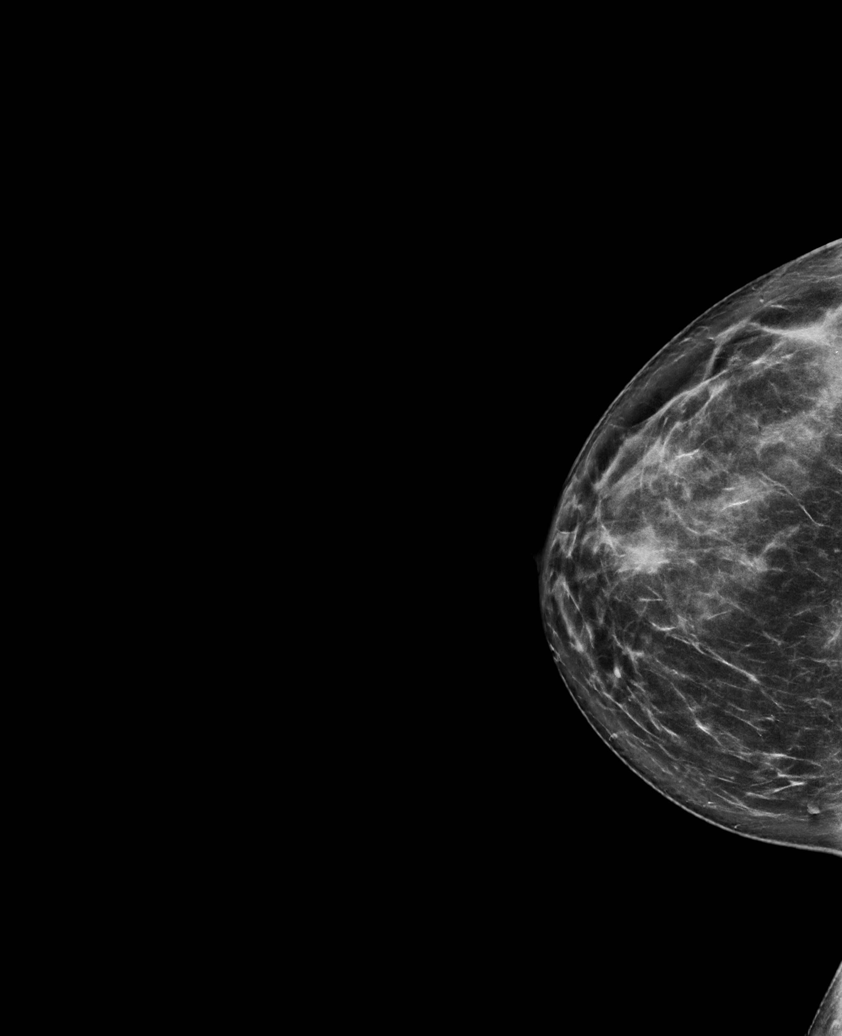

[R XCCL synth-2D]
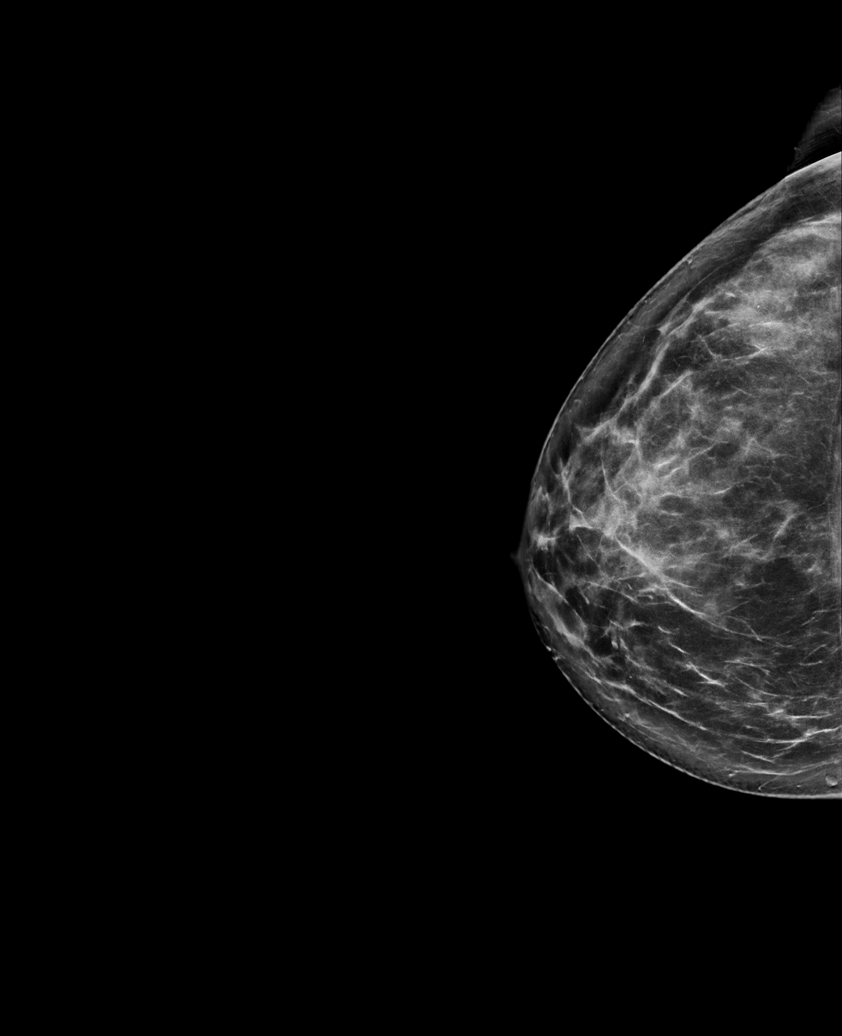

[R MLO synth-2D]
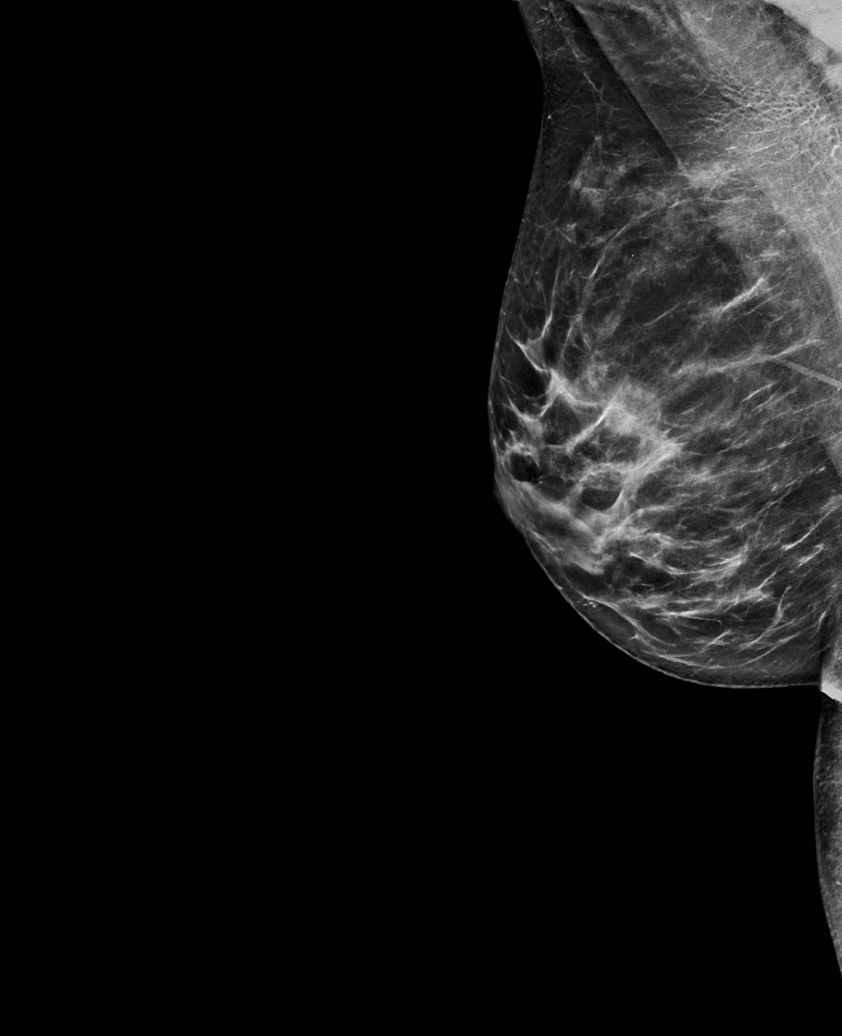

[L MLO synth-2D]
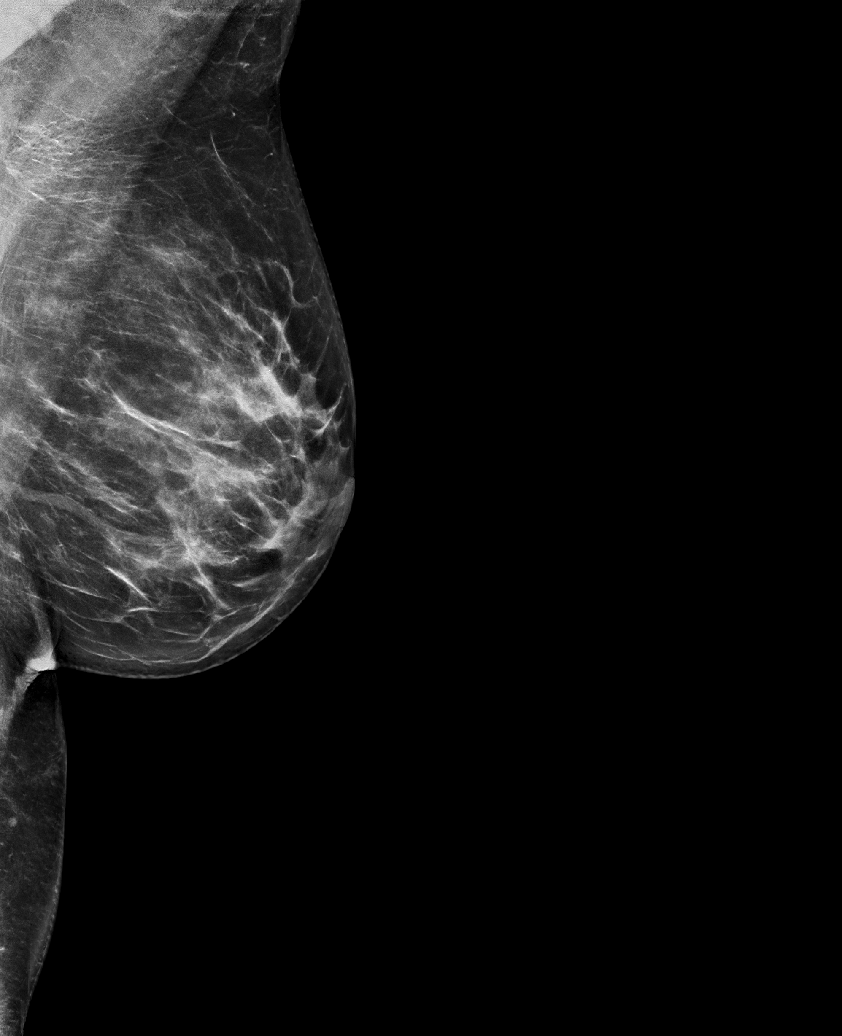

[L CC synth-2D]
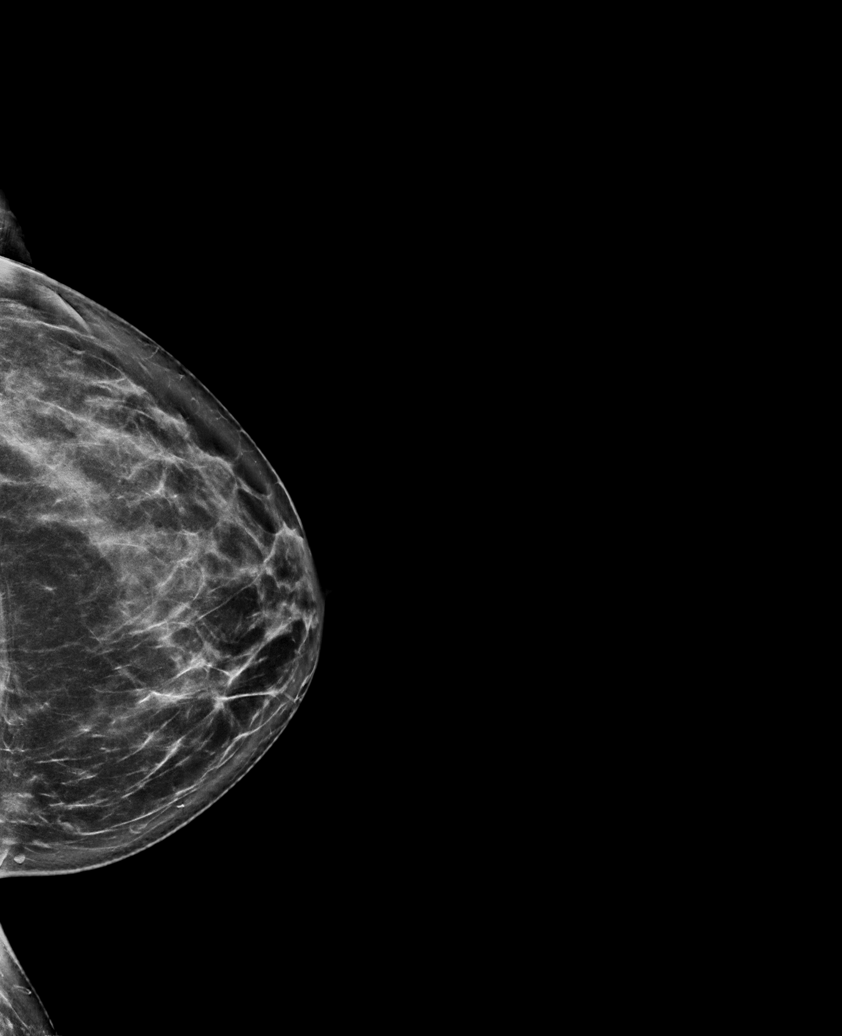

[R MLO tomo · tomo slice 37/74.0]
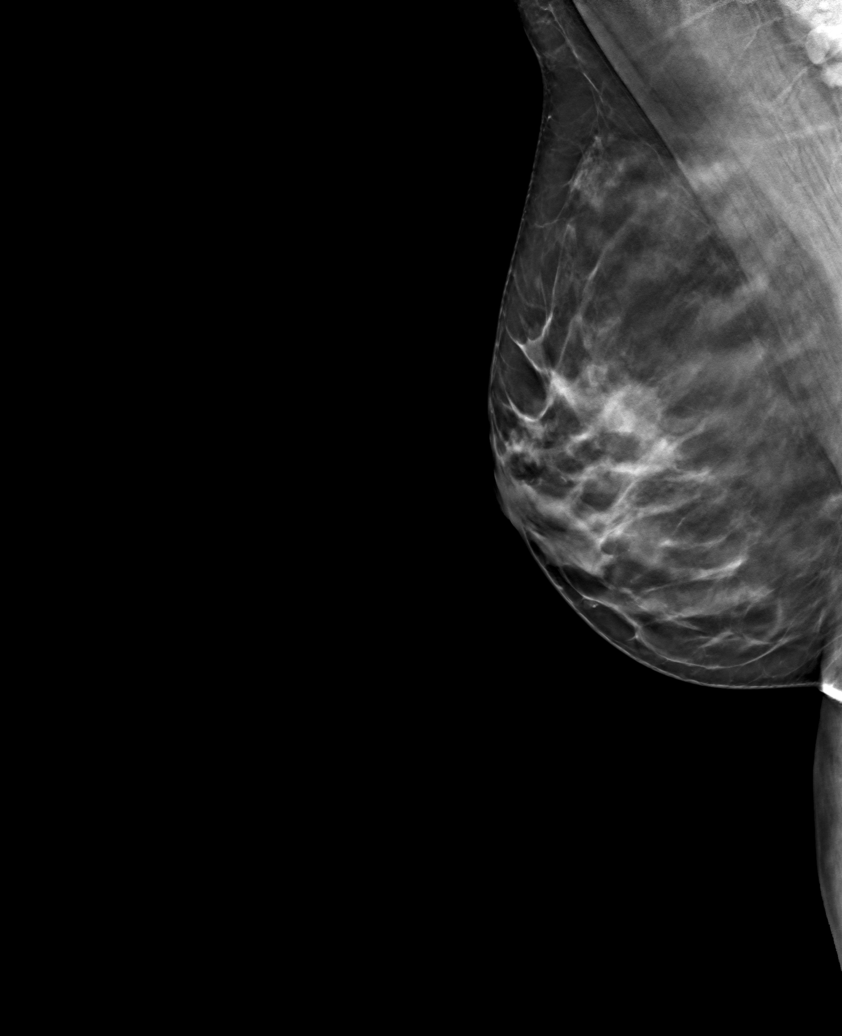

[6 of 30 positions shown; findings below may reference images not displayed]

ACR Breast Density Category c: The breast tissue is heterogeneously
dense, which may obscure small masses.
FINDINGS: In the right breast, a possible asymmetry warrants further
evaluation. In the left breast, no findings suspicious for
malignancy. Images were processed with CAD.
IMPRESSION: Further evaluation is suggested for possible asymmetry in the right
breast.

RECOMMENDATION:
Diagnostic mammogram and possibly ultrasound of the right breast.
(Code:EU-2-NNK)

The patient will be contacted regarding the findings, and additional
imaging will be scheduled.

BI-RADS CATEGORY  0: Incomplete. Need additional imaging evaluation
and/or prior mammograms for comparison.

## 2020-06-19 IMAGING — US US TRANSVAGINAL NON-OB
1 series · 13 of 25 positions shown · non-contrast
Comparison: None

CLINICAL DATA: Irregular menstruation.  Enlarged uterus.



[Series 1: us transvaginal non-ob · 0.17mm/px · 13 of 82 slices shown]
[im 1/82]
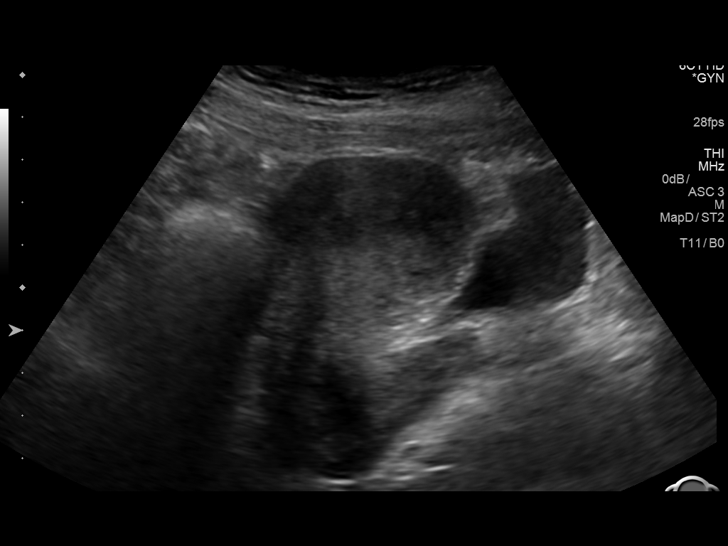
[im 7/82]
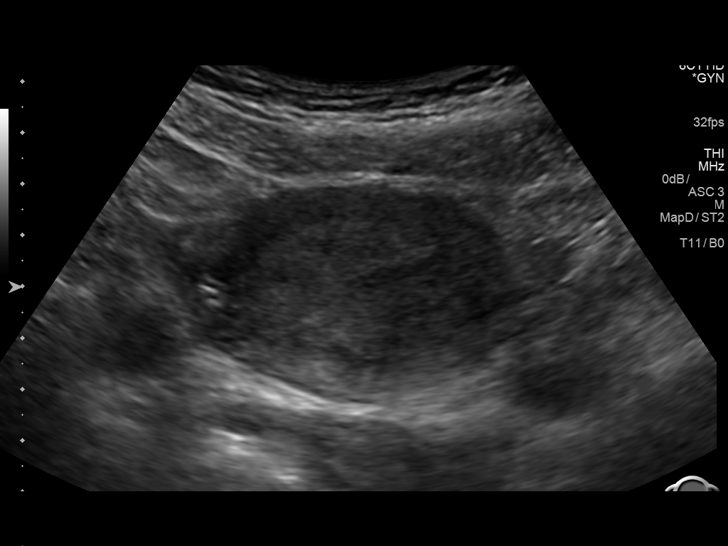
[im 14/82]
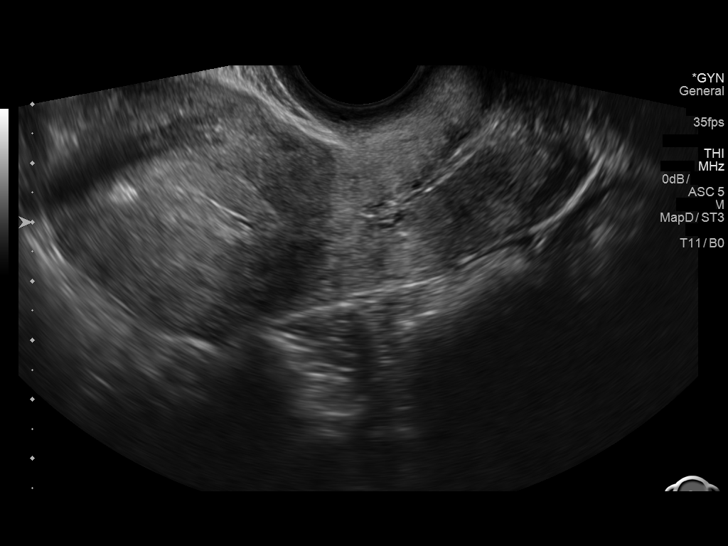
[im 21/82]
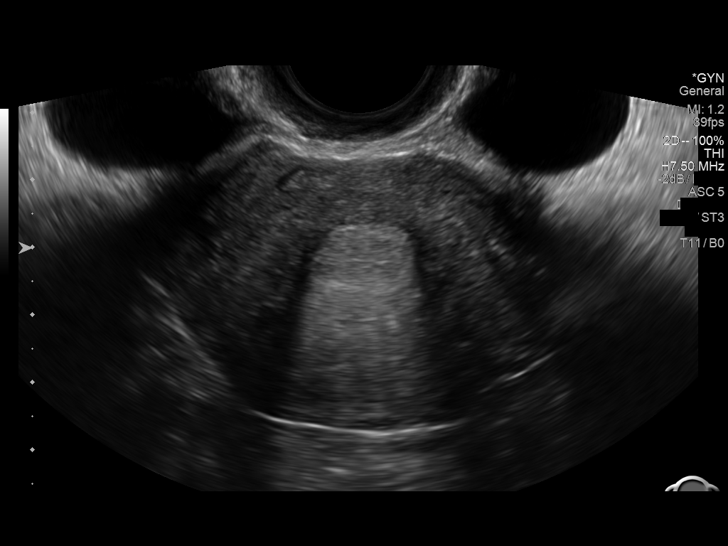
[im 28/82]
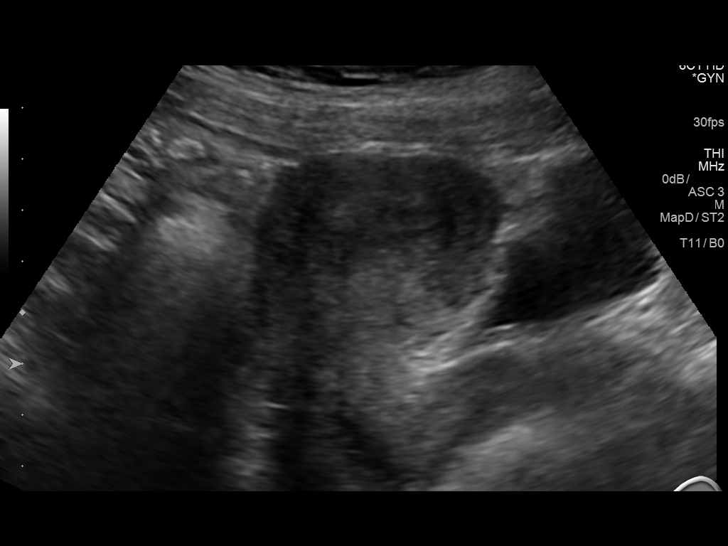
[im 34/82]
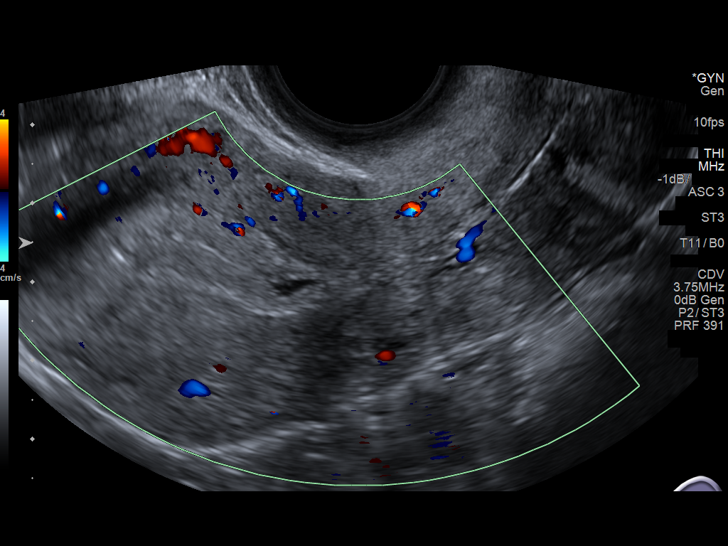
[im 41/82]
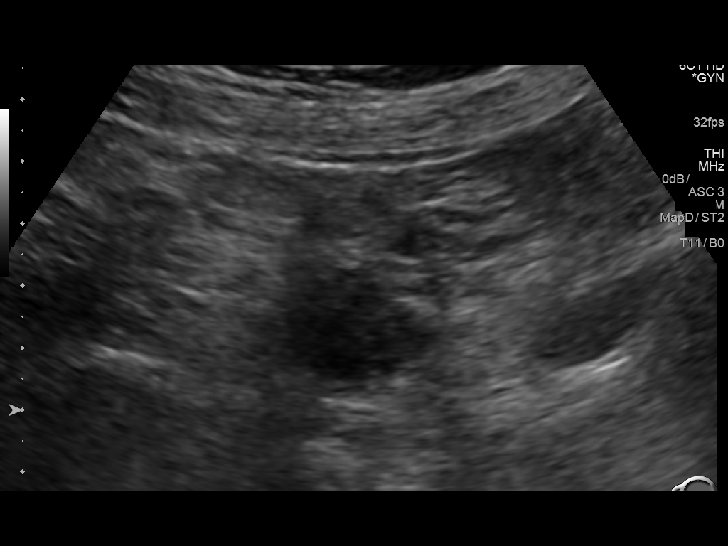
[im 48/82]
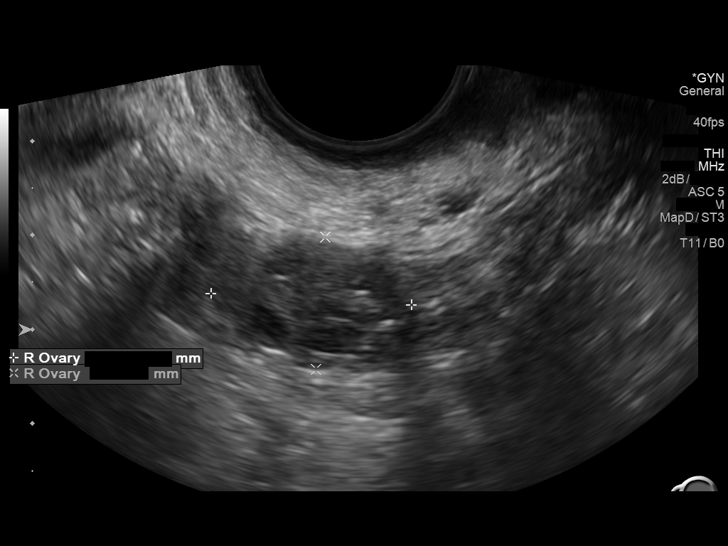
[im 55/82]
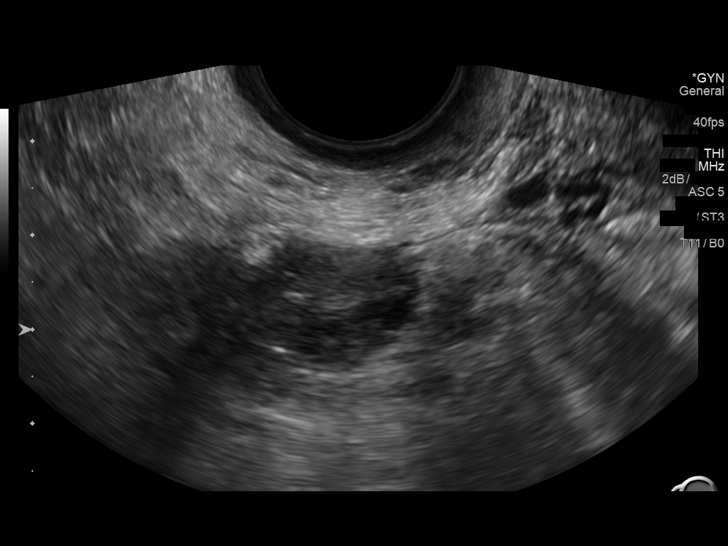
[im 61/82]
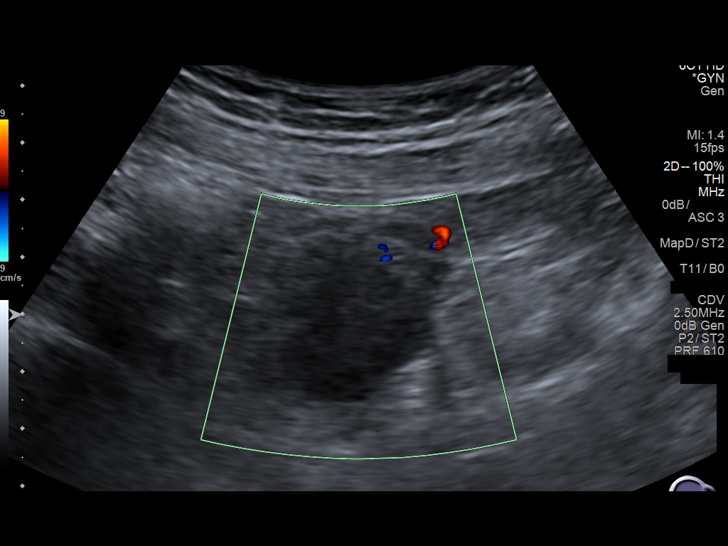
[im 68/82]
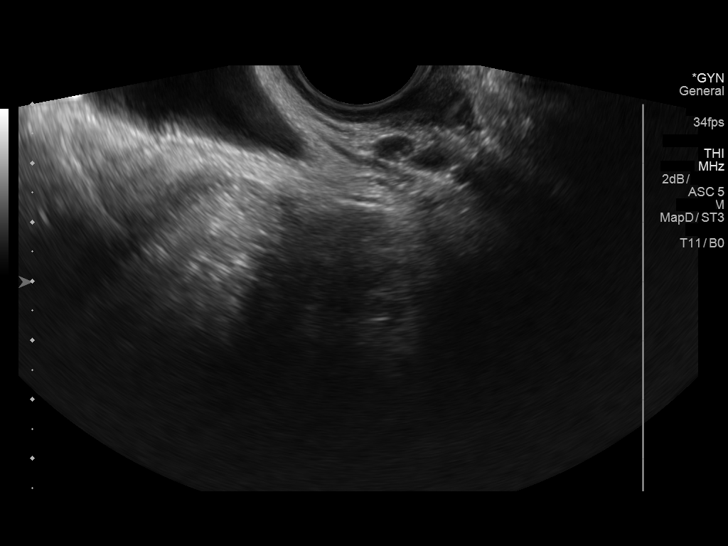
[im 75/82]
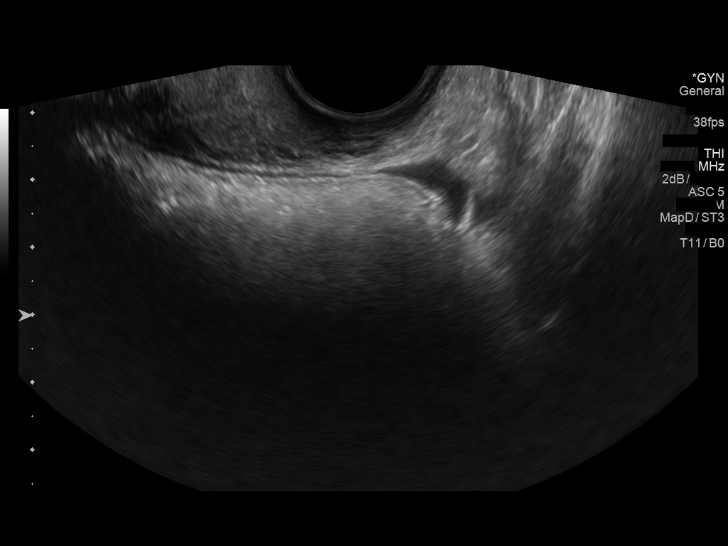
[im 82/82]
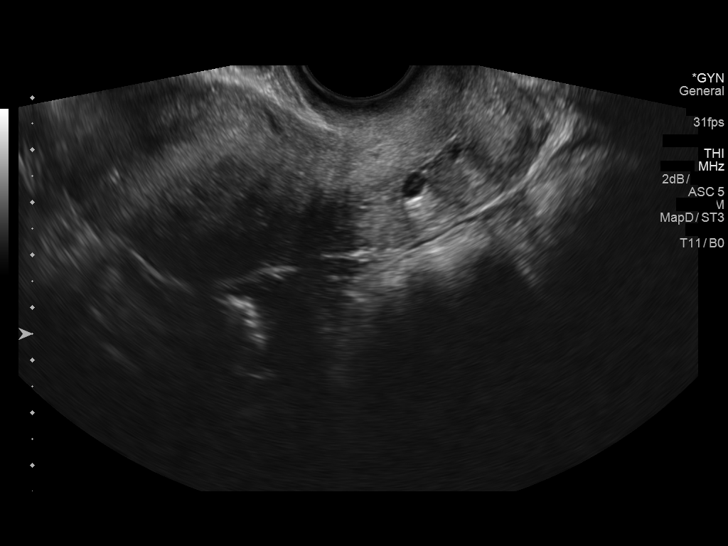

[13 of 25 positions shown; findings below may reference images not displayed]

FINDINGS: Uterus

Measurements: 9.2 x 4.4 x 5.6 cm = volume: 117 mL. No fibroids or
other mass visualized.

Endometrium

Thickness: 11 mm.  Small amount of fluid in the endometrial canal.

Right ovary

Measurements: 2.1 x 1.4 x 1.9 cm = volume: 2.9 mL. Normal
appearance/no adnexal mass.

Left ovary

Measurements: 3.4 x 3.0 x 2.7 cm = volume: 14.8 mL. Normal
appearance/no adnexal mass.

Other findings

No abnormal free fluid.
IMPRESSION: No acute process within the pelvis.

Endometrium measures 11 mm. If bleeding remains unresponsive to
hormonal or medical therapy, sonohysterogram should be considered
for focal lesion work-up. (Ref: Radiological Reasoning: Algorithmic
Workup of Abnormal Vaginal Bleeding with Endovaginal Sonography and
Sonohysterography. AJR [id number]; [id number]:[id number])
